# Patient Record
Sex: Female | Born: 1935 | Race: White | Hispanic: No | Marital: Married | Smoking: Never smoker
Health system: Southern US, Community
[De-identification: ages and names within clinical notes are randomized; demographics above are authoritative.]

## PROBLEM LIST (undated history)

## (undated) DIAGNOSIS — I6529 Occlusion and stenosis of unspecified carotid artery: Secondary | ICD-10-CM

## (undated) DIAGNOSIS — M810 Age-related osteoporosis without current pathological fracture: Secondary | ICD-10-CM

## (undated) DIAGNOSIS — I10 Essential (primary) hypertension: Secondary | ICD-10-CM

## (undated) DIAGNOSIS — R2681 Unsteadiness on feet: Secondary | ICD-10-CM

## (undated) DIAGNOSIS — H3530 Unspecified macular degeneration: Secondary | ICD-10-CM

## (undated) DIAGNOSIS — E039 Hypothyroidism, unspecified: Secondary | ICD-10-CM

## (undated) DIAGNOSIS — I2510 Atherosclerotic heart disease of native coronary artery without angina pectoris: Secondary | ICD-10-CM

## (undated) DIAGNOSIS — R569 Unspecified convulsions: Secondary | ICD-10-CM

## (undated) DIAGNOSIS — E538 Deficiency of other specified B group vitamins: Secondary | ICD-10-CM

## (undated) DIAGNOSIS — I4892 Unspecified atrial flutter: Secondary | ICD-10-CM

## (undated) DIAGNOSIS — E559 Vitamin D deficiency, unspecified: Secondary | ICD-10-CM

## (undated) HISTORY — PX: OTHER SURGICAL HISTORY: SHX169

## (undated) HISTORY — PX: CHOLECYSTECTOMY: SHX55

---

## 2004-05-08 HISTORY — PX: HIP SURGERY: SHX245

## 2011-12-17 ENCOUNTER — Encounter (HOSPITAL_COMMUNITY): Payer: Self-pay | Admitting: *Deleted

## 2011-12-17 ENCOUNTER — Emergency Department (HOSPITAL_COMMUNITY): Payer: Medicare Other

## 2011-12-17 ENCOUNTER — Emergency Department (HOSPITAL_COMMUNITY)
Admission: EM | Admit: 2011-12-17 | Discharge: 2011-12-17 | Disposition: A | Discharge status: Home / Self Care | Payer: Medicare Other | Attending: Emergency Medicine | Admitting: Emergency Medicine

## 2011-12-17 DIAGNOSIS — Y92410 Unspecified street and highway as the place of occurrence of the external cause: Secondary | ICD-10-CM | POA: Insufficient documentation

## 2011-12-17 DIAGNOSIS — G8929 Other chronic pain: Secondary | ICD-10-CM | POA: Insufficient documentation

## 2011-12-17 DIAGNOSIS — Z9089 Acquired absence of other organs: Secondary | ICD-10-CM | POA: Insufficient documentation

## 2011-12-17 DIAGNOSIS — E119 Type 2 diabetes mellitus without complications: Secondary | ICD-10-CM | POA: Insufficient documentation

## 2011-12-17 DIAGNOSIS — M25559 Pain in unspecified hip: Secondary | ICD-10-CM | POA: Insufficient documentation

## 2011-12-17 DIAGNOSIS — Z96649 Presence of unspecified artificial hip joint: Secondary | ICD-10-CM | POA: Insufficient documentation

## 2011-12-17 LAB — CBC WITH DIFFERENTIAL/PLATELET
Basophils Relative: 0 % (ref 0–1)
Eosinophils Absolute: 0.3 10*3/uL (ref 0.0–0.7)
Eosinophils Relative: 3 % (ref 0–5)
HCT: 38.4 % (ref 36.0–46.0)
Hemoglobin: 12.6 g/dL (ref 12.0–15.0)
Lymphs Abs: 2.6 10*3/uL (ref 0.7–4.0)
MCH: 28.3 pg (ref 26.0–34.0)
MCHC: 32.8 g/dL (ref 30.0–36.0)
MCV: 86.3 fL (ref 78.0–100.0)
Monocytes Absolute: 1.4 10*3/uL — ABNORMAL HIGH (ref 0.1–1.0)
Monocytes Relative: 13 % — ABNORMAL HIGH (ref 3–12)
Neutrophils Relative %: 59 % (ref 43–77)
RBC: 4.45 MIL/uL (ref 3.87–5.11)

## 2011-12-17 LAB — BASIC METABOLIC PANEL
BUN: 25 mg/dL — ABNORMAL HIGH (ref 6–23)
Calcium: 9 mg/dL (ref 8.4–10.5)
Creatinine, Ser: 0.74 mg/dL (ref 0.50–1.10)
GFR calc Af Amer: 89 mL/min — ABNORMAL LOW (ref >90)
GFR calc non Af Amer: 77 mL/min — ABNORMAL LOW (ref >90)
Glucose, Bld: 88 mg/dL (ref 70–99)
Potassium: 4.6 mEq/L (ref 3.5–5.1)

## 2011-12-17 MED ORDER — ACETAMINOPHEN 500 MG PO TABS: 1000.0000 mg | ORAL_TABLET | Freq: Once | ORAL | Status: DC
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
## 2011-12-21 NOTE — ED Provider Notes (Signed)
I saw and evaluated the patient, reviewed the resident's note and I agree with the findings and plan.   Cyndra Numbers, MD 12/21/11 [phone]

## 2016-02-21 ENCOUNTER — Emergency Department (HOSPITAL_BASED_OUTPATIENT_CLINIC_OR_DEPARTMENT_OTHER): Payer: Medicare Other

## 2016-02-21 ENCOUNTER — Emergency Department (HOSPITAL_BASED_OUTPATIENT_CLINIC_OR_DEPARTMENT_OTHER)
Admission: EM | Admit: 2016-02-21 | Discharge: 2016-02-21 | Disposition: A | Discharge status: Skilled Nursing Facility | Payer: Medicare Other | Attending: Emergency Medicine | Admitting: Emergency Medicine

## 2016-02-21 ENCOUNTER — Encounter (HOSPITAL_BASED_OUTPATIENT_CLINIC_OR_DEPARTMENT_OTHER): Payer: Self-pay | Admitting: Emergency Medicine

## 2016-02-21 DIAGNOSIS — E119 Type 2 diabetes mellitus without complications: Secondary | ICD-10-CM | POA: Diagnosis not present

## 2016-02-21 DIAGNOSIS — Z791 Long term (current) use of non-steroidal anti-inflammatories (NSAID): Secondary | ICD-10-CM | POA: Insufficient documentation

## 2016-02-21 DIAGNOSIS — Y999 Unspecified external cause status: Secondary | ICD-10-CM | POA: Insufficient documentation

## 2016-02-21 DIAGNOSIS — I2510 Atherosclerotic heart disease of native coronary artery without angina pectoris: Secondary | ICD-10-CM | POA: Diagnosis not present

## 2016-02-21 DIAGNOSIS — Y929 Unspecified place or not applicable: Secondary | ICD-10-CM | POA: Insufficient documentation

## 2016-02-21 DIAGNOSIS — S01311A Laceration without foreign body of right ear, initial encounter: Secondary | ICD-10-CM | POA: Diagnosis not present

## 2016-02-21 DIAGNOSIS — Z79899 Other long term (current) drug therapy: Secondary | ICD-10-CM | POA: Diagnosis not present

## 2016-02-21 DIAGNOSIS — W19XXXA Unspecified fall, initial encounter: Secondary | ICD-10-CM

## 2016-02-21 DIAGNOSIS — Z7982 Long term (current) use of aspirin: Secondary | ICD-10-CM | POA: Diagnosis not present

## 2016-02-21 DIAGNOSIS — W228XXA Striking against or struck by other objects, initial encounter: Secondary | ICD-10-CM | POA: Diagnosis not present

## 2016-02-21 DIAGNOSIS — S0990XA Unspecified injury of head, initial encounter: Secondary | ICD-10-CM | POA: Diagnosis present

## 2016-02-21 DIAGNOSIS — Y939 Activity, unspecified: Secondary | ICD-10-CM | POA: Diagnosis not present

## 2016-02-21 DIAGNOSIS — I10 Essential (primary) hypertension: Secondary | ICD-10-CM | POA: Insufficient documentation

## 2016-02-21 DIAGNOSIS — E039 Hypothyroidism, unspecified: Secondary | ICD-10-CM | POA: Diagnosis not present

## 2016-02-21 HISTORY — DX: Occlusion and stenosis of unspecified carotid artery: I65.29

## 2016-02-21 HISTORY — DX: Unsteadiness on feet: R26.81

## 2016-02-21 HISTORY — DX: Deficiency of other specified B group vitamins: E53.8

## 2016-02-21 HISTORY — DX: Atherosclerotic heart disease of native coronary artery without angina pectoris: I25.10

## 2016-02-21 HISTORY — DX: Unspecified macular degeneration: H35.30

## 2016-02-21 HISTORY — DX: Age-related osteoporosis without current pathological fracture: M81.0

## 2016-02-21 HISTORY — DX: Unspecified convulsions: R56.9

## 2016-02-21 HISTORY — DX: Vitamin D deficiency, unspecified: E55.9

## 2016-02-21 HISTORY — DX: Unspecified atrial flutter: I48.92

## 2016-02-21 HISTORY — DX: Essential (primary) hypertension: I10

## 2016-02-21 HISTORY — DX: Hypothyroidism, unspecified: E03.9

## 2016-02-21 LAB — CBC
HCT: 40.5 % (ref 36.0–46.0)
HEMOGLOBIN: 13.4 g/dL (ref 12.0–15.0)
MCH: 29.3 pg (ref 26.0–34.0)
MCHC: 33.1 g/dL (ref 30.0–36.0)
MCV: 88.6 fL (ref 78.0–100.0)
Platelets: 266 10*3/uL (ref 150–400)
RBC: 4.57 MIL/uL (ref 3.87–5.11)
RDW: 16.3 % — ABNORMAL HIGH (ref 11.5–15.5)
WBC: 7.5 10*3/uL (ref 4.0–10.5)

## 2016-02-21 LAB — URINALYSIS, ROUTINE W REFLEX MICROSCOPIC
Bilirubin Urine: NEGATIVE
GLUCOSE, UA: NEGATIVE mg/dL
HGB URINE DIPSTICK: NEGATIVE
Ketones, ur: 15 mg/dL — AB
LEUKOCYTES UA: NEGATIVE
Nitrite: NEGATIVE
Protein, ur: NEGATIVE mg/dL
SPECIFIC GRAVITY, URINE: 1.014 (ref 1.005–1.030)
pH: 7 (ref 5.0–8.0)

## 2016-02-21 LAB — BASIC METABOLIC PANEL
ANION GAP: 7 (ref 5–15)
BUN: 18 mg/dL (ref 6–20)
CHLORIDE: 102 mmol/L (ref 101–111)
CO2: 27 mmol/L (ref 22–32)
Calcium: 9 mg/dL (ref 8.9–10.3)
Creatinine, Ser: 0.81 mg/dL (ref 0.44–1.00)
GFR calc non Af Amer: >60 mL/min (ref >60)
Glucose, Bld: 96 mg/dL (ref 65–99)
Potassium: 4.3 mmol/L (ref 3.5–5.1)
Sodium: 136 mmol/L (ref 135–145)

## 2016-02-21 MED ORDER — LIDOCAINE HCL 1 % IJ SOLN
5.0000 mL | Freq: Once | INTRAMUSCULAR | Status: AC
  Administered 2016-02-21: 5 mL via INTRADERMAL
  Filled 2016-02-21: qty 20

## 2016-02-21 NOTE — ED Notes (Signed)
[facility] here for transport. 

## 2016-02-21 NOTE — ED Notes (Signed)
[facility] called for transport.  

## 2016-02-21 NOTE — ED Notes (Signed)
Pt has lac to the top of her rt ear. Cleaned with sterile water and swabs.
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
## 2016-02-21 NOTE — ED Triage Notes (Signed)
Pt from nursing home, unwitnessed fall.  Pt was standing up from the toilet, got dizzy and tipped over.  Pt had dizziness all morning.  Pt has laceration to right ear from hitting the wall.  NO Loc.

## 2016-02-21 NOTE — ED Notes (Signed)
MD at bedside. 

## 2016-07-19 ENCOUNTER — Emergency Department (HOSPITAL_BASED_OUTPATIENT_CLINIC_OR_DEPARTMENT_OTHER)
Admission: EM | Admit: 2016-07-19 | Discharge: 2016-07-19 | Disposition: A | Discharge status: Home / Self Care | Payer: Medicare Other | Attending: Emergency Medicine | Admitting: Emergency Medicine

## 2016-07-19 ENCOUNTER — Encounter (HOSPITAL_BASED_OUTPATIENT_CLINIC_OR_DEPARTMENT_OTHER): Payer: Self-pay | Admitting: *Deleted

## 2016-07-19 ENCOUNTER — Emergency Department (HOSPITAL_BASED_OUTPATIENT_CLINIC_OR_DEPARTMENT_OTHER): Payer: Medicare Other

## 2016-07-19 DIAGNOSIS — W19XXXD Unspecified fall, subsequent encounter: Secondary | ICD-10-CM

## 2016-07-19 DIAGNOSIS — Z7982 Long term (current) use of aspirin: Secondary | ICD-10-CM | POA: Insufficient documentation

## 2016-07-19 DIAGNOSIS — E039 Hypothyroidism, unspecified: Secondary | ICD-10-CM | POA: Diagnosis not present

## 2016-07-19 DIAGNOSIS — Y929 Unspecified place or not applicable: Secondary | ICD-10-CM | POA: Diagnosis not present

## 2016-07-19 DIAGNOSIS — M25462 Effusion, left knee: Secondary | ICD-10-CM | POA: Diagnosis not present

## 2016-07-19 DIAGNOSIS — M542 Cervicalgia: Secondary | ICD-10-CM | POA: Diagnosis not present

## 2016-07-19 DIAGNOSIS — I10 Essential (primary) hypertension: Secondary | ICD-10-CM | POA: Insufficient documentation

## 2016-07-19 DIAGNOSIS — R51 Headache: Secondary | ICD-10-CM | POA: Diagnosis not present

## 2016-07-19 DIAGNOSIS — I2510 Atherosclerotic heart disease of native coronary artery without angina pectoris: Secondary | ICD-10-CM | POA: Insufficient documentation

## 2016-07-19 DIAGNOSIS — H539 Unspecified visual disturbance: Secondary | ICD-10-CM | POA: Insufficient documentation

## 2016-07-19 DIAGNOSIS — Z79899 Other long term (current) drug therapy: Secondary | ICD-10-CM | POA: Diagnosis not present

## 2016-07-19 DIAGNOSIS — S50311A Abrasion of right elbow, initial encounter: Secondary | ICD-10-CM | POA: Diagnosis not present

## 2016-07-19 DIAGNOSIS — S199XXA Unspecified injury of neck, initial encounter: Secondary | ICD-10-CM | POA: Diagnosis present

## 2016-07-19 DIAGNOSIS — Y999 Unspecified external cause status: Secondary | ICD-10-CM | POA: Diagnosis not present

## 2016-07-19 DIAGNOSIS — W07XXXA Fall from chair, initial encounter: Secondary | ICD-10-CM | POA: Diagnosis not present

## 2016-07-19 DIAGNOSIS — Y939 Activity, unspecified: Secondary | ICD-10-CM | POA: Insufficient documentation

## 2016-07-19 DIAGNOSIS — E119 Type 2 diabetes mellitus without complications: Secondary | ICD-10-CM | POA: Diagnosis not present

## 2016-07-19 LAB — CBC
HEMATOCRIT: 38.1 % (ref 36.0–46.0)
HEMOGLOBIN: 12.5 g/dL (ref 12.0–15.0)
MCH: 28 pg (ref 26.0–34.0)
MCHC: 32.8 g/dL (ref 30.0–36.0)
MCV: 85.2 fL (ref 78.0–100.0)
Platelets: 319 10*3/uL (ref 150–400)
RBC: 4.47 MIL/uL (ref 3.87–5.11)
RDW: 17.2 % — ABNORMAL HIGH (ref 11.5–15.5)
WBC: 8.8 10*3/uL (ref 4.0–10.5)

## 2016-07-19 LAB — COMPREHENSIVE METABOLIC PANEL
ALBUMIN: 3.2 g/dL — AB (ref 3.5–5.0)
ALT: 10 U/L — AB (ref 14–54)
AST: 17 U/L (ref 15–41)
Alkaline Phosphatase: 49 U/L (ref 38–126)
Anion gap: 4 — ABNORMAL LOW (ref 5–15)
BILIRUBIN TOTAL: 0.4 mg/dL (ref 0.3–1.2)
BUN: 17 mg/dL (ref 6–20)
CALCIUM: 8.8 mg/dL — AB (ref 8.9–10.3)
CO2: 28 mmol/L (ref 22–32)
CREATININE: 0.9 mg/dL (ref 0.44–1.00)
Chloride: 103 mmol/L (ref 101–111)
GFR calc Af Amer: >60 mL/min (ref >60)
GFR, EST NON AFRICAN AMERICAN: 56 mL/min — AB (ref >60)
GLUCOSE: 92 mg/dL (ref 65–99)
POTASSIUM: 4.4 mmol/L (ref 3.5–5.1)
Sodium: 135 mmol/L (ref 135–145)
TOTAL PROTEIN: 6.2 g/dL — AB (ref 6.5–8.1)

## 2016-07-19 LAB — CBG MONITORING, ED: GLUCOSE-CAPILLARY: 78 mg/dL (ref 65–99)

## 2016-07-19 MED ORDER — ACETAMINOPHEN 325 MG PO TABS
650.0000 mg | ORAL_TABLET | Freq: Once | ORAL | Status: AC
  Administered 2016-07-19: 650 mg via ORAL
  Filled 2016-07-19 (×2): qty 2

## 2016-07-19 NOTE — ED Notes (Signed)
[facility], [facility].

## 2016-07-19 NOTE — ED Triage Notes (Signed)
Pt to room 5 by [facility], reporting pt with fall forward out of chair at [facility]. No loc per witnesses, no abrasions, lacerations, swelling or bleeding noted. Pt is a/a/ox4, smiling in nad.

## 2016-07-19 NOTE — ED Provider Notes (Signed)
[facility]-EMERGENCY DEPT Provider Note   CSN: [id number] Arrival date & time: 07/19/16  0930  History   Chief Complaint Chief Complaint  Patient presents with  . Fall    HPI Mallory Daniel is a 83 y.o. female with a h/o of falls, macular degeneration. and Afib who presents to the Emergency Department brought by EMS from assisted living following a fall. She has a history of chronic intermittent dizziness and fell this morning while bending over in her closet to get her shoes. No loss of consciousness. She was unable to get up on her own. She now presents with complaints of constant neck pain immediately following the fall, pain to the crown of her head, and left knee pain that began after she returned to her feet. She is arrived in a c-collar placed by EMS. No LOC. She denies back pain, pain to the left hip or ankle, abdominal or chest pain.   Ambulates with a walker at baseline. PMH includes Afib and macular degeneration. She is followed by Dr. Hyacinth [facility]ekerMiller for [facility] [place]. Next follow-up appointment is tomorrow.   HPI  Past Medical History:  Diagnosis Date  . Atrial flutter (HCC)   . B12 deficiency   . Carotid artery stenosis   . Coronary artery disease   . Diabetes mellitus   . Hypertension   . Hypothyroidism   . Macular degeneration   . Osteoporosis   . Seizures (HCC)   . Unsteady gait   . Vitamin D deficiency     There are no active problems to display for this patient.   Past Surgical History:  Procedure Laterality Date  . CHOLECYSTECTOMY    . gastic staples    . HIP SURGERY  2006   right hip replacement     OB History    No data available       Home Medications    Prior to Admission medications   Medication Sig Start Date End Date Taking? Authorizing Provider  acetaminophen (TYLENOL) 500 MG tablet Take 500 mg by mouth every 6 (six) hours as needed. For pain    Historical Provider, MD  aspirin 81 MG chewable tablet Chew by mouth daily.     Historical Provider, MD  cholecalciferol (VITAMIN D) 1000 units tablet Take 1,000 Units by mouth daily.    Historical Provider, MD  divalproex (DEPAKOTE) 125 MG DR tablet Take 125 mg by mouth 3 (three) times daily.    Historical Provider, MD  DULoxetine (CYMBALTA) 60 MG capsule Take 60 mg by mouth daily.     Historical Provider, MD  Estradiol [facility]) Place vaginally.    Historical Provider, MD  levothyroxine (SYNTHROID, LEVOTHROID) 88 MCG tablet Take 88 mcg by mouth daily before breakfast.    Historical Provider, MD  loratadine (CLARITIN) 10 MG tablet Take 10 mg by mouth daily.    Historical Provider, MD  metoprolol (LOPRESSOR) 50 MG tablet Take 25 mg by mouth 2 (two) times daily.    Historical Provider, MD  vitamin B-12 (CYANOCOBALAMIN) 1000 MCG tablet Take 1,000 mcg by mouth daily.    Historical Provider, MD  Vitamin D, Ergocalciferol, (DRISDOL) 50000 units CAPS capsule Take 50,000 Units by mouth every 7 (seven) days.    Historical Provider, MD    Family History History reviewed. No pertinent family history.  Social History Social History  Substance Use Topics  . Smoking status: Never Smoker  . Smokeless tobacco: Never Used  . Alcohol use No  Allergies   Demerol [meperidine] and Gabapentin   Review of Systems Review of Systems  Constitutional: Negative for fever.  Eyes: Positive for visual disturbance (macular degeneration- chronic).  Respiratory: Negative for shortness of breath.   Cardiovascular: Negative for chest pain.  Gastrointestinal: Negative for abdominal pain.  Genitourinary: Negative for flank pain.  Musculoskeletal: Positive for neck pain. Negative for back pain.  Skin: Positive for wound (previous fall).  Neurological: Positive for dizziness (chronic) and headaches.  Psychiatric/Behavioral: Negative for confusion.     Physical Exam Updated Vital Signs BP 118/82 (BP Location: Right Arm)   Pulse 66   Temp 98.1 F (36.7 C) (Oral)   Resp 18   SpO2  100%   Physical Exam  Constitutional: She is oriented to person, place, and time. She appears well-developed and well-nourished.  HENT:  Head: Normocephalic.  Right Ear: External ear normal.  Left Ear: External ear normal.  Nose: Nose normal.  Mildly TTP over the crown of the head and bilateral forehead. No ecchymosis or abrasions.   Eyes: Conjunctivae and EOM are normal. Pupils are equal, round, and reactive to light.  Visual fields limited bilaterally due to macular degeneration.   Neck:  Patient arrived in a c-collar.  Cardiovascular: Normal rate and intact distal pulses.  Exam reveals no gallop and no friction rub.   No murmur heard. Pulmonary/Chest: Effort normal and breath sounds normal. No respiratory distress. She has no wheezes. She exhibits no tenderness.  Abdominal: Soft. Bowel sounds are normal. She exhibits no distension and no mass. There is no tenderness. There is no rebound and no guarding.  Musculoskeletal: She exhibits tenderness. She exhibits no edema or deformity.       Left knee: She exhibits effusion (minimal). She exhibits normal range of motion, no ecchymosis, no deformity, no laceration, no erythema, normal patellar mobility, no bony tenderness and no MCL laxity. Tenderness found. Lateral joint line tenderness noted. No medial joint line and no patellar tendon tenderness noted.  TTP over the left posterior cervical spine. Mild midline tenderness. No ecchymosis or abrasions.     Neurological: She is alert and oriented to person, place, and time. No cranial nerve deficit or sensory deficit. She exhibits normal muscle tone.  Skin: Skin is warm and dry.  There is a 2.5 x 1.5 cm hemostatic abrasion to the right anterolateral elbow with granulomatous tissue. No signs of infection. The patient reports wound occurred from a fall on 07/14/16.   Psychiatric: Her behavior is normal.  Nursing note and vitals reviewed.   ED Treatments / Results  Labs (all labs ordered are  listed, but only abnormal results are displayed) Labs Reviewed  CBC - Abnormal; Notable for the following:       Result Value   RDW 17.2 (*)    All other components within normal limits  COMPREHENSIVE METABOLIC PANEL - Abnormal; Notable for the following:    Calcium 8.8 (*)    Total Protein 6.2 (*)    Albumin 3.2 (*)    ALT 10 (*)    GFR calc non Af Amer 56 (*)    Anion gap 4 (*)    All other components within normal limits  CBG MONITORING, ED    EKG  EKG Interpretation  Date/Time:  Wednesday July 19 2016 11:32:14 EDT Ventricular Rate:  60 PR Interval:    QRS Duration: 100 QT Interval:  424 QTC Calculation: 424 R Axis:   60 Text Interpretation:  Atrial fibrillation Low voltage, precordial leads  ST/T changes less prominent than when compared to Oct 2017 Confirmed by Criss Alvine MD, SCOTT [phone]) on 07/19/2016 11:36:58 AM       Radiology Ct Head Wo Contrast  Result Date: 07/19/2016 CLINICAL DATA:  Recent fall with head and neck pain, initial encounter EXAM: CT HEAD WITHOUT CONTRAST CT CERVICAL SPINE WITHOUT CONTRAST TECHNIQUE: Multidetector CT imaging of the head and cervical spine was performed following the standard protocol without intravenous contrast. Multiplanar CT image reconstructions of the cervical spine were also generated. COMPARISON:  03/05/2016 FINDINGS: CT HEAD FINDINGS Brain: Chronic atrophic and chronic white matter ischemic changes are noted. No findings to suggest acute hemorrhage, acute infarction or space-occupying mass lesion are seen. Vascular: No hyperdense vessel or unexpected calcification. Skull: Normal. Negative for fracture or focal lesion. Sinuses/Orbits: No acute finding. Other: None. CT CERVICAL SPINE FINDINGS Alignment: Within normal limits. Skull base and vertebrae: 7 cervical segments are well visualized. Vertebral body height is well maintained. No acute fracture or acute facet abnormality is seen. Soft tissues and spinal canal: No prevertebral fluid  or swelling. No visible canal hematoma. Disc levels: No acute disc pathology is noted. Some disc space narrowing is again seen at C5-6 stable from the prior exam. Upper chest: Negative. IMPRESSION: CT of the head: Chronic changes without acute abnormality. CT of cervical spine: Mild degenerative change without acute abnormality. Electronically Signed   By: Alcide Clever M.D.   On: 07/19/2016 10:56   Ct Cervical Spine Wo Contrast  Result Date: 07/19/2016 CLINICAL DATA:  Recent fall with head and neck pain, initial encounter EXAM: CT HEAD WITHOUT CONTRAST CT CERVICAL SPINE WITHOUT CONTRAST TECHNIQUE: Multidetector CT imaging of the head and cervical spine was performed following the standard protocol without intravenous contrast. Multiplanar CT image reconstructions of the cervical spine were also generated. COMPARISON:  03/05/2016 FINDINGS: CT HEAD FINDINGS Brain: Chronic atrophic and chronic white matter ischemic changes are noted. No findings to suggest acute hemorrhage, acute infarction or space-occupying mass lesion are seen. Vascular: No hyperdense vessel or unexpected calcification. Skull: Normal. Negative for fracture or focal lesion. Sinuses/Orbits: No acute finding. Other: None. CT CERVICAL SPINE FINDINGS Alignment: Within normal limits. Skull base and vertebrae: 7 cervical segments are well visualized. Vertebral body height is well maintained. No acute fracture or acute facet abnormality is seen. Soft tissues and spinal canal: No prevertebral fluid or swelling. No visible canal hematoma. Disc levels: No acute disc pathology is noted. Some disc space narrowing is again seen at C5-6 stable from the prior exam. Upper chest: Negative. IMPRESSION: CT of the head: Chronic changes without acute abnormality. CT of cervical spine: Mild degenerative change without acute abnormality. Electronically Signed   By: Alcide Clever M.D.   On: 07/19/2016 10:56   Dg Knee Complete 4 Views Left  Result Date:  07/19/2016 CLINICAL DATA:  Pain following fall EXAM: LEFT KNEE - COMPLETE 4+ VIEW COMPARISON:  None. FINDINGS: Frontal, lateral, and bilateral oblique views were obtained. There is no appreciable fracture or dislocation. There is a small joint effusion. There is moderate generalized joint space narrowing, slightly more severe laterally than elsewhere. There is spurring in all compartments. Bones are osteoporotic. No erosive change. IMPRESSION: Multifocal osteoarthritic change. Small joint effusion. Bones somewhat osteoporotic. No fracture or dislocation. Electronically Signed   By: Bretta Bang III M.D.   On: 07/19/2016 10:53    Procedures Procedures (including critical care time)  Medications Ordered in ED Medications  acetaminophen (TYLENOL) tablet 650 mg (650 mg Oral Given 07/19/16  1027)     Initial Impression / Assessment and Plan / ED Course  I have reviewed the triage vital signs and the nursing notes.  Pertinent labs & imaging results that were available during my care of the patient were reviewed by me and considered in my medical decision making (see chart for details).     83 year old patient with chronic dizziness, Afib, and recurrent falls here for evaluation following a fall. She is TTP over the posterior neck, left lateral knee, forehead and crown of the head, and behind the left eye. C-spine and head CT are negative for fracture. Left knee X-ray is negative. No electrolyte abnormalities seen on labs. Kidney function is normal. The patient was ambulated through the unit with assistance, and was somewhat unsteady, but reports this is her baseline. The patient has a follow-up appointment with her neurologist scheduled for 3/15. Discussed discharging the patient to assisted living and following up with neurology tomorrow. She is agreeable with the plan at this time.   Final Clinical Impressions(s) / ED Diagnoses   Final diagnoses:  Fall, subsequent encounter    New  Prescriptions Discharge Medication List as of 07/19/2016  1:54 PM       Rahman Ferrall Conan Bowens, PA-C 07/20/16 1026    Pricilla Loveless, MD 07/29/16 [phone]

## 2016-07-19 NOTE — ED Triage Notes (Signed)
Pt reports neck pain, c collar in place by ems.

## 2016-07-19 NOTE — ED Notes (Signed)
Patient transported to X-ray 

## 2016-07-19 NOTE — ED Notes (Signed)
ED Provider at bedside. 

## 2016-07-19 NOTE — Discharge Instructions (Signed)
Pain can be treated with Tylenol.

## 2016-08-02 ENCOUNTER — Emergency Department (HOSPITAL_BASED_OUTPATIENT_CLINIC_OR_DEPARTMENT_OTHER): Payer: Medicare Other

## 2016-08-02 ENCOUNTER — Emergency Department (HOSPITAL_BASED_OUTPATIENT_CLINIC_OR_DEPARTMENT_OTHER)
Admission: EM | Admit: 2016-08-02 | Discharge: 2016-08-02 | Disposition: A | Discharge status: Home / Self Care | Payer: Medicare Other | Attending: Emergency Medicine | Admitting: Emergency Medicine

## 2016-08-02 ENCOUNTER — Encounter (HOSPITAL_BASED_OUTPATIENT_CLINIC_OR_DEPARTMENT_OTHER): Payer: Self-pay | Admitting: Emergency Medicine

## 2016-08-02 DIAGNOSIS — Z7982 Long term (current) use of aspirin: Secondary | ICD-10-CM | POA: Diagnosis not present

## 2016-08-02 DIAGNOSIS — E119 Type 2 diabetes mellitus without complications: Secondary | ICD-10-CM | POA: Diagnosis not present

## 2016-08-02 DIAGNOSIS — Y999 Unspecified external cause status: Secondary | ICD-10-CM | POA: Insufficient documentation

## 2016-08-02 DIAGNOSIS — Z7901 Long term (current) use of anticoagulants: Secondary | ICD-10-CM | POA: Diagnosis not present

## 2016-08-02 DIAGNOSIS — T148XXA Other injury of unspecified body region, initial encounter: Secondary | ICD-10-CM | POA: Insufficient documentation

## 2016-08-02 DIAGNOSIS — Y939 Activity, unspecified: Secondary | ICD-10-CM | POA: Insufficient documentation

## 2016-08-02 DIAGNOSIS — M25511 Pain in right shoulder: Secondary | ICD-10-CM | POA: Diagnosis not present

## 2016-08-02 DIAGNOSIS — R51 Headache: Secondary | ICD-10-CM | POA: Insufficient documentation

## 2016-08-02 DIAGNOSIS — I10 Essential (primary) hypertension: Secondary | ICD-10-CM | POA: Insufficient documentation

## 2016-08-02 DIAGNOSIS — I2510 Atherosclerotic heart disease of native coronary artery without angina pectoris: Secondary | ICD-10-CM | POA: Insufficient documentation

## 2016-08-02 DIAGNOSIS — Y92009 Unspecified place in unspecified non-institutional (private) residence as the place of occurrence of the external cause: Secondary | ICD-10-CM | POA: Diagnosis not present

## 2016-08-02 DIAGNOSIS — E039 Hypothyroidism, unspecified: Secondary | ICD-10-CM | POA: Insufficient documentation

## 2016-08-02 DIAGNOSIS — W19XXXA Unspecified fall, initial encounter: Secondary | ICD-10-CM | POA: Insufficient documentation

## 2016-08-02 DIAGNOSIS — S4991XA Unspecified injury of right shoulder and upper arm, initial encounter: Secondary | ICD-10-CM | POA: Diagnosis present

## 2016-08-02 LAB — BASIC METABOLIC PANEL
Anion gap: 5 (ref 5–15)
BUN: 17 mg/dL (ref 6–20)
CHLORIDE: 104 mmol/L (ref 101–111)
CO2: 29 mmol/L (ref 22–32)
CREATININE: 0.94 mg/dL (ref 0.44–1.00)
Calcium: 9 mg/dL (ref 8.9–10.3)
GFR calc Af Amer: >60 mL/min (ref >60)
GFR, EST NON AFRICAN AMERICAN: 53 mL/min — AB (ref >60)
Glucose, Bld: 84 mg/dL (ref 65–99)
Potassium: 4.2 mmol/L (ref 3.5–5.1)
SODIUM: 138 mmol/L (ref 135–145)

## 2016-08-02 LAB — URINALYSIS, ROUTINE W REFLEX MICROSCOPIC
BILIRUBIN URINE: NEGATIVE
GLUCOSE, UA: NEGATIVE mg/dL
HGB URINE DIPSTICK: NEGATIVE
Ketones, ur: NEGATIVE mg/dL
Leukocytes, UA: NEGATIVE
Nitrite: NEGATIVE
Protein, ur: NEGATIVE mg/dL
SPECIFIC GRAVITY, URINE: 1.017 (ref 1.005–1.030)
pH: 7 (ref 5.0–8.0)

## 2016-08-02 LAB — VALPROIC ACID LEVEL: Valproic Acid Lvl: 16 ug/mL — ABNORMAL LOW (ref 50.0–100.0)

## 2016-08-02 LAB — CK: CK TOTAL: 28 U/L — AB (ref 38–234)

## 2016-08-02 LAB — DIGOXIN LEVEL: DIGOXIN LVL: 0.7 ng/mL — AB (ref 0.8–2.0)

## 2016-08-02 NOTE — ED Notes (Signed)
Spoke to daughter of pt.  Informed her of patients condition and findings.  Notified of patient being discharged with changes to medication.  She related pt also has appt with neurologist next Wednesday.

## 2016-08-02 NOTE — ED Triage Notes (Signed)
Pt from nursing facility.  Pt found by staff on the floor after falling.  Pt has hematoma to back of head and c/o right shoulder pain.  EKG per ems shows Afib rate 57.  Pt denies dizziness.

## 2016-08-02 NOTE — ED Provider Notes (Signed)
[facility]   CSN: [id number] Arrival date & time: 08/02/16  [id number]     History   Chief Complaint Chief Complaint  Patient presents with  . Fall    HPI Mallory Daniel is a 83 y.o. female.History is obtained fromDwynetta Mclean, medical technician at skilled nursing facility, from patient and from records accompanying patient. Patient was found on the floor 7 AM today lying flat on her back. Patient thinks she fell approximately 11 PM last night because "[employer] [place] was on" she complains of mild occipital headache and mild right shoulder pain since the fall. No other associated symptoms. No treatment prior to coming here. She denies neck pain. Nothing made symptoms better or worse. Patient is prone to frequent falls.  HPI  Past Medical History:  Diagnosis Date  . Atrial flutter (HCC)   . B12 deficiency   . Carotid artery stenosis   . Coronary artery disease   . Diabetes mellitus   . Hypertension   . Hypothyroidism   . Macular degeneration   . Osteoporosis   . Seizures (HCC)   . Unsteady gait   . Vitamin D deficiency     There are no active problems to display for this patient.   Past Surgical History:  Procedure Laterality Date  . CHOLECYSTECTOMY    . gastic staples    . HIP SURGERY  2006   right hip replacement     OB History    No data available       Home Medications    Prior to Admission medications   Medication Sig Start Date End Date Taking? Authorizing Provider  acetaminophen (TYLENOL) 500 MG tablet Take 500 mg by mouth every 6 (six) hours as needed. For pain   Yes Historical Provider, MD  aspirin 81 MG chewable tablet Chew by mouth daily.   Yes Historical Provider, MD  digoxin (LANOXIN) 0.125 MG tablet Take 0.125 mg by mouth daily.   Yes Historical Provider, MD  divalproex (DEPAKOTE) 125 MG DR tablet Take 125 mg by mouth 3 (three) times daily.   Yes Historical Provider, MD  DULoxetine (CYMBALTA) 60 MG capsule Take 60 mg by  mouth daily.    Yes Historical Provider, MD  escitalopram (LEXAPRO) 10 MG tablet Take 10 mg by mouth daily.   Yes Historical Provider, MD  estradiol (ESTRACE) 0.1 MG/GM vaginal cream Place 1 Applicatorful vaginally every 14 (fourteen) days.   Yes Historical Provider, MD  levothyroxine (SYNTHROID, LEVOTHROID) 88 MCG tablet Take 88 mcg by mouth daily before breakfast.   Yes Historical Provider, MD  loratadine (CLARITIN) 10 MG tablet Take 10 mg by mouth daily.   Yes Historical Provider, MD  metoprolol (LOPRESSOR) 50 MG tablet Take 25 mg by mouth 2 (two) times daily.   Yes Historical Provider, MD  vitamin B-12 (CYANOCOBALAMIN) 1000 MCG tablet Take 1,000 mcg by mouth daily.   Yes Historical Provider, MD  Vitamin D, Ergocalciferol, (DRISDOL) 50000 units CAPS capsule Take 50,000 Units by mouth every 7 (seven) days.   Yes Historical Provider, MD    Family History No family history on file.  Social History Social History  Substance Use Topics  . Smoking status: Never Smoker  . Smokeless tobacco: Never Used  . Alcohol use No     Allergies   Demerol [meperidine]; Gabapentin; and Statins   Review of Systems Review of Systems  Musculoskeletal: Positive for arthralgias and gait problem.       Walks with walker. Right shoulder  pain.  Neurological: Positive for headaches.     Physical Exam Updated Vital Signs BP 127/74 (BP Location: Left Arm)   Pulse 65   Temp 97.9 F (36.6 C) (Oral)   Resp 18   Ht 4\' 11"  (1.499 m)   Wt 144 lb (65.3 kg)   SpO2 96%   BMI 29.08 kg/m   Physical Exam  Constitutional: She appears well-developed and well-nourished.  Alert appropriate Glasgow Coma Score 15  HENT:  Slight swelling occiput with minimal course point tenderness. Otherwise normocephalic atraumatic  Eyes: EOM are normal.  Neck: Neck supple. No tracheal deviation present. No thyromegaly present.  No point tenderness no bruit  Cardiovascular:  No murmur heard. Irregularly irregular    Pulmonary/Chest: Effort normal and breath sounds normal.  Abdominal: Soft. Bowel sounds are normal. She exhibits no distension. There is no tenderness.  Musculoskeletal: Normal range of motion. She exhibits no edema or tenderness.  Entire spine nontender. Pelvis stable nontender. Right upper extremity minimally tender at the posterior shoulder. No ecchymosis. Full range of motion. All other extremities or contusion abrasion or tenderness neurovascularly intact  Neurological: She is alert. Coordination normal.  Moves all extremities motor strength 5 over 5 overall cranial nerves II through XII grossly intact. No facial asymmetry.  Skin: Skin is warm and dry. No rash noted.  Psychiatric: She has a normal mood and affect.  Nursing note and vitals reviewed.    ED Treatments / Results  Labs (all labs ordered are listed, but only abnormal results are displayed) Labs Reviewed  CK  BASIC METABOLIC PANEL  URINALYSIS, ROUTINE W REFLEX MICROSCOPIC  DIGOXIN LEVEL  VALPROIC ACID LEVEL    EKG  EKG Interpretation  Date/Time:  Wednesday August 02 2016 08:37:55 EDT Ventricular Rate:  70 PR Interval:    QRS Duration: 103 QT Interval:  411 QTC Calculation: 444 R Axis:   36 Text Interpretation:  Atrial fibrillation Low voltage, precordial leads Borderline repolarization abnormality No significant change since last tracing Confirmed by Ethelda Chick  MD, Shakur Lembo ([id number]) on 08/02/2016 8:45:24 AM       Radiology No results found.  Procedures Procedures (including critical care time)  Medications Ordered in ED Medications - No data to display   Initial Impression / Assessment and Plan / ED Course  I have reviewed the triage vital signs and the nursing notes.  Pertinent labs & imaging results that were available during my care of the patient were reviewed by me and considered in my medical decision making (see chart for details).   declines pain medicine  X-rays viewed by me Patient's  valproic acid level subtherapeutic. Discussed with hospital pharmacist. Will increased Depakote DR dosage to 125 mg 3 times a day. Her MAR states she is taking Depakote 125 mg once daily presently. Depakote level should be rechecked within 2 weeks. I dont feelthat digoxin dosage needs to be adjusted and that she has normal vital signs.  At 10:25 PM she is asymptomatic and pain-free Results for orders placed or performed during the hospital encounter of 08/02/16  CK  Result Value Ref Range   Total CK 28 (L) 38 - 234 U/L  Basic metabolic panel  Result Value Ref Range   Sodium 138 135 - 145 mmol/L   Potassium 4.2 3.5 - 5.1 mmol/L   Chloride 104 101 - 111 mmol/L   CO2 29 22 - 32 mmol/L   Glucose, Bld 84 65 - 99 mg/dL   BUN 17 6 - 20 mg/dL  Creatinine, Ser 0.94 0.44 - 1.00 mg/dL   Calcium 9.0 8.9 - 16.1 mg/dL   GFR calc non Af Amer 53 (L) >60 mL/min   GFR calc Af Amer >60 >60 mL/min   Anion gap 5 5 - 15  Urinalysis, Routine w reflex microscopic  Result Value Ref Range   Color, Urine YELLOW YELLOW   APPearance CLEAR CLEAR   Specific Gravity, Urine 1.017 1.005 - 1.030   pH 7.0 5.0 - 8.0   Glucose, UA NEGATIVE NEGATIVE mg/dL   Hgb urine dipstick NEGATIVE NEGATIVE   Bilirubin Urine NEGATIVE NEGATIVE   Ketones, ur NEGATIVE NEGATIVE mg/dL   Protein, ur NEGATIVE NEGATIVE mg/dL   Nitrite NEGATIVE NEGATIVE   Leukocytes, UA NEGATIVE NEGATIVE  Digoxin level  Result Value Ref Range   Digoxin Level 0.7 (L) 0.8 - 2.0 ng/mL  Valproic acid level  Result Value Ref Range   Valproic Acid Lvl 16 (L) 50.0 - 100.0 ug/mL   Dg Shoulder Right  Result Date: 08/02/2016 CLINICAL DATA:  [place] syncope and fall last night with a right shoulder injury. Pain. Initial encounter. EXAM: RIGHT SHOULDER - 2+ VIEW COMPARISON:  None. FINDINGS: There is no evidence of fracture or dislocation. Mild acromioclavicular osteoarthritis is seen. Image right lung and ribs appear normal. Soft tissues are unremarkable.  IMPRESSION: No acute abnormality. Mild acromioclavicular osteoarthritis. Electronically Signed   By: Drusilla Kanner M.D.   On: 08/02/2016 09:16   Ct Head Wo Contrast  Result Date: 08/02/2016 CLINICAL DATA:  Larey Seat during the night in the home. Found on the floor. EXAM: CT HEAD WITHOUT CONTRAST CT CERVICAL SPINE WITHOUT CONTRAST TECHNIQUE: Multidetector CT imaging of the head and cervical spine was performed following the standard protocol without intravenous contrast. Multiplanar CT image reconstructions of the cervical spine were also generated. COMPARISON:  07/19/2016 FINDINGS: CT HEAD FINDINGS Brain: Generalized brain atrophy. Chronic small-vessel ischemic changes throughout the cerebral hemispheric white matter. Old small vessel infarctions within the thalami and basal ganglia. No sign of acute infarction, mass lesion, hemorrhage, hydrocephalus or extra-axial collection. Vascular: There is atherosclerotic calcification of the major vessels at the base of the brain. Skull: Normal Sinuses/Orbits: Clear/normal Other: None CT CERVICAL SPINE FINDINGS Alignment: Normal Skull base and vertebrae: No fracture or primary lesion. Soft tissues and spinal canal: Negative Disc levels: Ordinary disc space narrowing C5-6 and C6-7. No bony stenosis of the canal or foramina. Upper chest: Negative Other: None IMPRESSION: Head CT: No acute or traumatic finding. Atrophy and chronic small-vessel ischemic changes. Cervical spine CT: No acute or traumatic finding. Mild midcervical spondylosis. Electronically Signed   By: Paulina Fusi M.D.   On: 08/02/2016 09:16   Ct Head Wo Contrast  Result Date: 07/19/2016 CLINICAL DATA:  Recent fall with head and neck pain, initial encounter EXAM: CT HEAD WITHOUT CONTRAST CT CERVICAL SPINE WITHOUT CONTRAST TECHNIQUE: Multidetector CT imaging of the head and cervical spine was performed following the standard protocol without intravenous contrast. Multiplanar CT image reconstructions of the  cervical spine were also generated. COMPARISON:  03/05/2016 FINDINGS: CT HEAD FINDINGS Brain: Chronic atrophic and chronic white matter ischemic changes are noted. No findings to suggest acute hemorrhage, acute infarction or space-occupying mass lesion are seen. Vascular: No hyperdense vessel or unexpected calcification. Skull: Normal. Negative for fracture or focal lesion. Sinuses/Orbits: No acute finding. Other: None. CT CERVICAL SPINE FINDINGS Alignment: Within normal limits. Skull base and vertebrae: 7 cervical segments are well visualized. Vertebral body height is well maintained. No acute fracture or  acute facet abnormality is seen. Soft tissues and spinal canal: No prevertebral fluid or swelling. No visible canal hematoma. Disc levels: No acute disc pathology is noted. Some disc space narrowing is again seen at C5-6 stable from the prior exam. Upper chest: Negative. IMPRESSION: CT of the head: Chronic changes without acute abnormality. CT of cervical spine: Mild degenerative change without acute abnormality. Electronically Signed   By: Alcide Clever M.D.   On: 07/19/2016 10:56   Ct Cervical Spine Wo Contrast  Result Date: 08/02/2016 CLINICAL DATA:  Larey Seat during the night in the home. Found on the floor. EXAM: CT HEAD WITHOUT CONTRAST CT CERVICAL SPINE WITHOUT CONTRAST TECHNIQUE: Multidetector CT imaging of the head and cervical spine was performed following the standard protocol without intravenous contrast. Multiplanar CT image reconstructions of the cervical spine were also generated. COMPARISON:  07/19/2016 FINDINGS: CT HEAD FINDINGS Brain: Generalized brain atrophy. Chronic small-vessel ischemic changes throughout the cerebral hemispheric white matter. Old small vessel infarctions within the thalami and basal ganglia. No sign of acute infarction, mass lesion, hemorrhage, hydrocephalus or extra-axial collection. Vascular: There is atherosclerotic calcification of the major vessels at the base of the  brain. Skull: Normal Sinuses/Orbits: Clear/normal Other: None CT CERVICAL SPINE FINDINGS Alignment: Normal Skull base and vertebrae: No fracture or primary lesion. Soft tissues and spinal canal: Negative Disc levels: Ordinary disc space narrowing C5-6 and C6-7. No bony stenosis of the canal or foramina. Upper chest: Negative Other: None IMPRESSION: Head CT: No acute or traumatic finding. Atrophy and chronic small-vessel ischemic changes. Cervical spine CT: No acute or traumatic finding. Mild midcervical spondylosis. Electronically Signed   By: Paulina Fusi M.D.   On: 08/02/2016 09:16   Ct Cervical Spine Wo Contrast  Result Date: 07/19/2016 CLINICAL DATA:  Recent fall with head and neck pain, initial encounter EXAM: CT HEAD WITHOUT CONTRAST CT CERVICAL SPINE WITHOUT CONTRAST TECHNIQUE: Multidetector CT imaging of the head and cervical spine was performed following the standard protocol without intravenous contrast. Multiplanar CT image reconstructions of the cervical spine were also generated. COMPARISON:  03/05/2016 FINDINGS: CT HEAD FINDINGS Brain: Chronic atrophic and chronic white matter ischemic changes are noted. No findings to suggest acute hemorrhage, acute infarction or space-occupying mass lesion are seen. Vascular: No hyperdense vessel or unexpected calcification. Skull: Normal. Negative for fracture or focal lesion. Sinuses/Orbits: No acute finding. Other: None. CT CERVICAL SPINE FINDINGS Alignment: Within normal limits. Skull base and vertebrae: 7 cervical segments are well visualized. Vertebral body height is well maintained. No acute fracture or acute facet abnormality is seen. Soft tissues and spinal canal: No prevertebral fluid or swelling. No visible canal hematoma. Disc levels: No acute disc pathology is noted. Some disc space narrowing is again seen at C5-6 stable from the prior exam. Upper chest: Negative. IMPRESSION: CT of the head: Chronic changes without acute abnormality. CT of cervical  spine: Mild degenerative change without acute abnormality. Electronically Signed   By: Alcide Clever M.D.   On: 07/19/2016 10:56   Dg Knee Complete 4 Views Left  Result Date: 07/19/2016 CLINICAL DATA:  Pain following fall EXAM: LEFT KNEE - COMPLETE 4+ VIEW COMPARISON:  None. FINDINGS: Frontal, lateral, and bilateral oblique views were obtained. There is no appreciable fracture or dislocation. There is a small joint effusion. There is moderate generalized joint space narrowing, slightly more severe laterally than elsewhere. There is spurring in all compartments. Bones are osteoporotic. No erosive change. IMPRESSION: Multifocal osteoarthritic change. Small joint effusion. Bones somewhat osteoporotic. No fracture  or dislocation. Electronically Signed   By: Bretta Bang III M.D.   On: 07/19/2016 10:53   Final Clinical Impressions(s) / ED Diagnoses  Dx #1 fall #2 contusions multiple sites #3 minor head injury #4 subtherapeutic anticonvulsant drug level #5 subtherapeutic digoxin Final diagnoses:  None    New Prescriptions New Prescriptions   No medications on file     Doug Sou, MD 08/02/16 1031

## 2016-08-02 NOTE — Discharge Instructions (Signed)
Increase Depakote DR dosage to 125 mg 3 times daily. Depakote level should be rechecked within the next 2 weeks. Level today was subtherapeutic at 16

## 2016-08-07 ENCOUNTER — Emergency Department (HOSPITAL_BASED_OUTPATIENT_CLINIC_OR_DEPARTMENT_OTHER)
Admission: EM | Admit: 2016-08-07 | Discharge: 2016-08-07 | Disposition: A | Discharge status: Home / Self Care | Payer: Medicare Other | Attending: Physician Assistant | Admitting: Physician Assistant

## 2016-08-07 ENCOUNTER — Emergency Department (HOSPITAL_BASED_OUTPATIENT_CLINIC_OR_DEPARTMENT_OTHER): Payer: Medicare Other

## 2016-08-07 ENCOUNTER — Encounter (HOSPITAL_BASED_OUTPATIENT_CLINIC_OR_DEPARTMENT_OTHER): Payer: Self-pay | Admitting: Emergency Medicine

## 2016-08-07 DIAGNOSIS — S299XXA Unspecified injury of thorax, initial encounter: Secondary | ICD-10-CM | POA: Diagnosis present

## 2016-08-07 DIAGNOSIS — I10 Essential (primary) hypertension: Secondary | ICD-10-CM | POA: Insufficient documentation

## 2016-08-07 DIAGNOSIS — I2510 Atherosclerotic heart disease of native coronary artery without angina pectoris: Secondary | ICD-10-CM | POA: Diagnosis not present

## 2016-08-07 DIAGNOSIS — Y939 Activity, unspecified: Secondary | ICD-10-CM | POA: Diagnosis not present

## 2016-08-07 DIAGNOSIS — Y929 Unspecified place or not applicable: Secondary | ICD-10-CM | POA: Insufficient documentation

## 2016-08-07 DIAGNOSIS — Z79899 Other long term (current) drug therapy: Secondary | ICD-10-CM | POA: Insufficient documentation

## 2016-08-07 DIAGNOSIS — E039 Hypothyroidism, unspecified: Secondary | ICD-10-CM | POA: Insufficient documentation

## 2016-08-07 DIAGNOSIS — W1839XA Other fall on same level, initial encounter: Secondary | ICD-10-CM | POA: Diagnosis not present

## 2016-08-07 DIAGNOSIS — S20211A Contusion of right front wall of thorax, initial encounter: Secondary | ICD-10-CM | POA: Diagnosis not present

## 2016-08-07 DIAGNOSIS — E119 Type 2 diabetes mellitus without complications: Secondary | ICD-10-CM | POA: Insufficient documentation

## 2016-08-07 DIAGNOSIS — Z7982 Long term (current) use of aspirin: Secondary | ICD-10-CM | POA: Diagnosis not present

## 2016-08-07 DIAGNOSIS — Y999 Unspecified external cause status: Secondary | ICD-10-CM | POA: Diagnosis not present

## 2016-08-07 MED ORDER — ACETAMINOPHEN 325 MG PO TABS
650.0000 mg | ORAL_TABLET | Freq: Once | ORAL | Status: AC
  Administered 2016-08-07: 650 mg via ORAL
  Filled 2016-08-07: qty 2

## 2016-08-07 NOTE — ED Notes (Signed)
Given snack and gingerale  

## 2016-08-07 NOTE — Discharge Instructions (Signed)
You were seen today because he had pain in your ribs after fall. There are no fractures. You continue to take ibuprofen use heating pads or otherwise to help. If you notice any rash starting please return immediately to the emergency department.

## 2016-08-07 NOTE — ED Provider Notes (Signed)
[facility]   CSN: [id number] Arrival date & time: 08/07/16  1040     History   Chief Complaint Chief Complaint  Patient presents with  . Rib pain    HPI Mallory Daniel is a 83 y.o. female.  HPI   Patient is a [age] feel. She reports she's had pain to her right side of her chest wall since the follow week ago. At that time only shoulder x-ray was completed. Patient is eating and drinking normally. No other complaints such as abdominal pain or otherwise.  No falls since that time.  Past Medical History:  Diagnosis Date  . Atrial flutter (HCC)   . B12 deficiency   . Carotid artery stenosis   . Coronary artery disease   . Diabetes mellitus   . Hypertension   . Hypothyroidism   . Macular degeneration   . Osteoporosis   . Seizures (HCC)   . Unsteady gait   . Vitamin D deficiency     There are no active problems to display for this patient.   Past Surgical History:  Procedure Laterality Date  . CHOLECYSTECTOMY    . gastic staples    . HIP SURGERY  2006   right hip replacement     OB History    No data available       Home Medications    Prior to Admission medications   Medication Sig Start Date End Date Taking? Authorizing Provider  acetaminophen (TYLENOL) 500 MG tablet Take 500 mg by mouth every 6 (six) hours as needed. For pain    Historical Provider, MD  aspirin 81 MG chewable tablet Chew by mouth daily.    Historical Provider, MD  digoxin (LANOXIN) 0.125 MG tablet Take 0.125 mg by mouth daily.    Historical Provider, MD  divalproex (DEPAKOTE) 125 MG DR tablet Take 125 mg by mouth 3 (three) times daily.    Historical Provider, MD  DULoxetine (CYMBALTA) 60 MG capsule Take 60 mg by mouth daily.     Historical Provider, MD  escitalopram (LEXAPRO) 10 MG tablet Take 10 mg by mouth daily.    Historical Provider, MD  estradiol (ESTRACE) 0.1 MG/GM vaginal cream Place 1 Applicatorful vaginally every 14 (fourteen) days.    Historical  Provider, MD  levothyroxine (SYNTHROID, LEVOTHROID) 88 MCG tablet Take 88 mcg by mouth daily before breakfast.    Historical Provider, MD  loratadine (CLARITIN) 10 MG tablet Take 10 mg by mouth daily.    Historical Provider, MD  metoprolol (LOPRESSOR) 50 MG tablet Take 25 mg by mouth 2 (two) times daily.    Historical Provider, MD  vitamin B-12 (CYANOCOBALAMIN) 1000 MCG tablet Take 1,000 mcg by mouth daily.    Historical Provider, MD  Vitamin D, Ergocalciferol, (DRISDOL) 50000 units CAPS capsule Take 50,000 Units by mouth every 7 (seven) days.    Historical Provider, MD    Family History History reviewed. No pertinent family history.  Social History Social History  Substance Use Topics  . Smoking status: Never Smoker  . Smokeless tobacco: Never Used  . Alcohol use No     Allergies   Demerol [meperidine]; Gabapentin; and Statins   Review of Systems Review of Systems  Constitutional: Negative for activity change, fatigue and fever.  Respiratory: Positive for chest tightness. Negative for shortness of breath.   Cardiovascular: Positive for chest pain.  Gastrointestinal: Negative for abdominal pain, nausea and vomiting.  Skin: Negative for pallor, rash and wound.  All other  systems reviewed and are negative.    Physical Exam Updated Vital Signs BP 133/78   Pulse (!) 58   Temp 98.7 F (37.1 C) (Oral)   Resp 18   Ht  (1.499 m)   Wt 144 lb (65.3 kg)   SpO2 96%   BMI 29.08 kg/m   Physical Exam  Constitutional: She is oriented to person, place, and time. She appears well-developed and well-nourished.  HENT:  Head: Normocephalic and atraumatic.  Eyes: Right eye exhibits no discharge.  Cardiovascular: Normal rate, regular rhythm and normal heart sounds.   No murmur heard. Pulmonary/Chest: Effort normal and breath sounds normal. She has no wheezes. She has no rales. She exhibits tenderness.  Tenderness to palpation to the right chest wall  Abdominal: Soft. She  exhibits no distension. There is tenderness.  Patient has mild tenderness palpation although is distractible to the right upper quadrant right lower quadrant.  Neurological: She is oriented to person, place, and time.  Skin: Skin is warm and dry. She is not diaphoretic.  Psychiatric: She has a normal mood and affect.  Nursing note and vitals reviewed.    ED Treatments / Results  Labs (all labs ordered are listed, but only abnormal results are displayed) Labs Reviewed - No data to display  EKG  EKG Interpretation None       Radiology No results found.  Procedures Procedures (including critical care time)  Medications Ordered in ED Medications - No data to display   Initial Impression / Assessment and Plan / ED Course  I have reviewed the triage vital signs and the nursing notes.  Pertinent labs & imaging results that were available during my care of the patient were reviewed by me and considered in my medical decision making (see chart for details).     73 her old female presenting with chest wall pain. Patient reports the pain has gotten worse since her fall last week. She had no imaging at the of this area at that time. We will get a chest x-ray to review for rib fractures. Patient does not have any external signs of trauma. Patient has diffuse tenderness. Patient eating drinking normally has normal vital signs which are reassuring. Given the fact that the fall was week ago, does doubt serious intra-abdominal or thoracic pathology.  Patient ambulated well. Discussed with daughter who thought her mom may be exaggerating.  She walks at baseline and is taking PO.   Final Clinicshe is an 83 year old female presenting with rib pain to thatal Impressions(s) / ED Diagnoses   Final diagnoses:  None    New Prescriptions New Prescriptions   No medications on file     Courteney Randall An, MD 08/09/16 1605

## 2016-08-07 NOTE — ED Notes (Signed)
Patient was offered fluids but denied them.

## 2016-08-07 NOTE — ED Triage Notes (Signed)
Pt seen in ED last Wednesday for fall, pt states as soon as she got home her right ribs started hurting, denies another fall

## 2016-08-07 NOTE — ED Notes (Signed)
Pt does not need to be ambulate per RN Madelaine Bhat

## 2016-08-13 ENCOUNTER — Emergency Department (HOSPITAL_BASED_OUTPATIENT_CLINIC_OR_DEPARTMENT_OTHER): Payer: Medicare Other

## 2016-08-13 ENCOUNTER — Encounter (HOSPITAL_BASED_OUTPATIENT_CLINIC_OR_DEPARTMENT_OTHER): Payer: Self-pay | Admitting: Emergency Medicine

## 2016-08-13 ENCOUNTER — Emergency Department (HOSPITAL_BASED_OUTPATIENT_CLINIC_OR_DEPARTMENT_OTHER)
Admission: EM | Admit: 2016-08-13 | Discharge: 2016-08-14 | Disposition: A | Discharge status: Home / Self Care | Payer: Medicare Other | Attending: Emergency Medicine | Admitting: Emergency Medicine

## 2016-08-13 DIAGNOSIS — S0990XA Unspecified injury of head, initial encounter: Secondary | ICD-10-CM | POA: Diagnosis present

## 2016-08-13 DIAGNOSIS — E119 Type 2 diabetes mellitus without complications: Secondary | ICD-10-CM | POA: Diagnosis not present

## 2016-08-13 DIAGNOSIS — S80211A Abrasion, right knee, initial encounter: Secondary | ICD-10-CM | POA: Insufficient documentation

## 2016-08-13 DIAGNOSIS — I10 Essential (primary) hypertension: Secondary | ICD-10-CM | POA: Insufficient documentation

## 2016-08-13 DIAGNOSIS — W01198A Fall on same level from slipping, tripping and stumbling with subsequent striking against other object, initial encounter: Secondary | ICD-10-CM | POA: Insufficient documentation

## 2016-08-13 DIAGNOSIS — E039 Hypothyroidism, unspecified: Secondary | ICD-10-CM | POA: Diagnosis not present

## 2016-08-13 DIAGNOSIS — W19XXXA Unspecified fall, initial encounter: Secondary | ICD-10-CM

## 2016-08-13 DIAGNOSIS — Y92129 Unspecified place in nursing home as the place of occurrence of the external cause: Secondary | ICD-10-CM | POA: Diagnosis not present

## 2016-08-13 DIAGNOSIS — S01111A Laceration without foreign body of right eyelid and periocular area, initial encounter: Secondary | ICD-10-CM | POA: Diagnosis not present

## 2016-08-13 DIAGNOSIS — I2510 Atherosclerotic heart disease of native coronary artery without angina pectoris: Secondary | ICD-10-CM | POA: Diagnosis not present

## 2016-08-13 DIAGNOSIS — Z7982 Long term (current) use of aspirin: Secondary | ICD-10-CM | POA: Insufficient documentation

## 2016-08-13 DIAGNOSIS — Y999 Unspecified external cause status: Secondary | ICD-10-CM | POA: Insufficient documentation

## 2016-08-13 DIAGNOSIS — Z23 Encounter for immunization: Secondary | ICD-10-CM | POA: Diagnosis not present

## 2016-08-13 DIAGNOSIS — Y939 Activity, unspecified: Secondary | ICD-10-CM | POA: Diagnosis not present

## 2016-08-13 DIAGNOSIS — S0101XA Laceration without foreign body of scalp, initial encounter: Secondary | ICD-10-CM

## 2016-08-13 DIAGNOSIS — M25561 Pain in right knee: Secondary | ICD-10-CM

## 2016-08-13 MED ORDER — TETANUS-DIPHTH-ACELL PERTUSSIS 5-2.5-18.5 LF-MCG/0.5 IM SUSP
0.5000 mL | Freq: Once | INTRAMUSCULAR | Status: AC
  Administered 2016-08-13: 0.5 mL via INTRAMUSCULAR
  Filled 2016-08-13: qty 0.5

## 2016-08-13 MED ORDER — LIDOCAINE-EPINEPHRINE (PF) 2 %-1:200000 IJ SOLN
10.0000 mL | Freq: Once | INTRAMUSCULAR | Status: AC
  Administered 2016-08-13: 20 mL

## 2016-08-13 MED ORDER — LIDOCAINE-EPINEPHRINE (PF) 2 %-1:200000 IJ SOLN
INTRAMUSCULAR | Status: AC
  Administered 2016-08-13: 20 mL
  Filled 2016-08-13: qty 20

## 2016-08-13 NOTE — ED Provider Notes (Signed)
[facility] Provider Note   CSN: [id number] Arrival date & time: 08/13/16  2007 By signing my name below, I, Bridgette Habermann, attest that this documentation has been prepared under the direction and in the presence of Olegario Emberson, [place]. Electronically Signed: Bridgette Habermann, ED Scribe. 08/13/16. 10:04 PM.  History   Chief Complaint Chief Complaint  Patient presents with  . Fall    HPI The history is provided by the patient. No language interpreter was used.   HPI Comments: Noor Witte is a 83 y.o. female with h/o A-fib, CAD, DM, HTN, and seizures, who presents to the Emergency Department by EMS from nursing home for evaluation following a mechanical fall just PTA. Pt tripped over a cord in her room, fell, and struck her head. No LOC. Pt notes she has an area of bruising and a laceration to right supraorbital region with pain (4/10) and 4/10 pain to her right knee with a small abrasion. Pt was not given any medications PTA. She is ambulatory with a walker at baseline. Pt is not on blood thinners. Pt denies eye pain, visual disturbance, photophobia, nausea, vomiting, diarrhea, or any other associated symptoms. She is not sure if she is UTD with her tetanus.   Past Medical History:  Diagnosis Date  . Atrial flutter (HCC)   . B12 deficiency   . Carotid artery stenosis   . Coronary artery disease   . Diabetes mellitus   . Hypertension   . Hypothyroidism   . Macular degeneration   . Osteoporosis   . Seizures (HCC)   . Unsteady gait   . Vitamin D deficiency     There are no active problems to display for this patient.   Past Surgical History:  Procedure Laterality Date  . CHOLECYSTECTOMY    . gastic staples    . HIP SURGERY  2006   right hip replacement     OB History    No data available       Home Medications    Prior to Admission medications   Medication Sig Start Date End Date Taking? Authorizing Provider  acetaminophen (TYLENOL) 500 MG tablet Take  500 mg by mouth every 6 (six) hours as needed. For pain   Yes Historical Provider, MD  aspirin 81 MG chewable tablet Chew by mouth daily.   Yes Historical Provider, MD  digoxin (LANOXIN) 0.125 MG tablet Take 0.125 mg by mouth daily.   Yes Historical Provider, MD  divalproex (DEPAKOTE) 125 MG DR tablet Take 125 mg by mouth 3 (three) times daily.   Yes Historical Provider, MD  DULoxetine (CYMBALTA) 60 MG capsule Take 60 mg by mouth daily.    Yes Historical Provider, MD  escitalopram (LEXAPRO) 10 MG tablet Take 10 mg by mouth daily.   Yes Historical Provider, MD  estradiol (ESTRACE) 0.1 MG/GM vaginal cream Place 1 Applicatorful vaginally every 14 (fourteen) days.   Yes Historical Provider, MD  levothyroxine (SYNTHROID, LEVOTHROID) 88 MCG tablet Take 88 mcg by mouth daily before breakfast.   Yes Historical Provider, MD  loratadine (CLARITIN) 10 MG tablet Take 10 mg by mouth daily.   Yes Historical Provider, MD  metoprolol (LOPRESSOR) 50 MG tablet Take 25 mg by mouth 2 (two) times daily.   Yes Historical Provider, MD  vitamin B-12 (CYANOCOBALAMIN) 1000 MCG tablet Take 1,000 mcg by mouth daily.   Yes Historical Provider, MD  Vitamin D, Ergocalciferol, (DRISDOL) 50000 units CAPS capsule Take 50,000 Units by mouth every 7 (seven) days.  Yes Historical Provider, MD    Family History No family history on file.  Social History Social History  Substance Use Topics  . Smoking status: Never Smoker  . Smokeless tobacco: Never Used  . Alcohol use No     Allergies   Demerol [meperidine]; Gabapentin; and Statins   Review of Systems Review of Systems  Constitutional: Negative for chills and fever.  Eyes: Negative for photophobia, pain and visual disturbance.  Gastrointestinal: Negative for diarrhea, nausea and vomiting.  Musculoskeletal: Positive for arthralgias.  Skin: Positive for wound.  Neurological: Negative for syncope.  All other systems reviewed and are negative.  Physical Exam Updated  Vital Signs BP 132/84 (BP Location: Right Arm)   Pulse 78   Temp 97.7 F (36.5 C) (Oral)   Resp 16   Ht  (1.499 m)   Wt 65.3 kg   SpO2 96%   BMI 29.08 kg/m   Physical Exam  Constitutional: She is oriented to person, place, and time. She appears well-developed and well-nourished.  Well appearing  HENT:  Head: Normocephalic. Head is with laceration.  Nose: Nose normal.  Mouth/Throat: Oropharynx is clear and moist.  Right supraorbital laceration (1.5 cm), no active bleeding. Swelling and bruising present to the area. No pain or tenderness with movement of eye during EOM exam.  Negative raccoon's eyes. Negative Battle sign.  Eyes: Conjunctivae and EOM are normal. Pupils are equal, round, and reactive to light.  Neck: Normal range of motion. Muscular tenderness present.  Tenderness to palpation to right side of cervical neck.  Cardiovascular: Normal rate, normal heart sounds and intact distal pulses.   No murmur heard. Pulmonary/Chest: Effort normal and breath sounds normal. No respiratory distress. She has no wheezes. She has no rales.  Normal work of breathing. No respiratory distress noted.   Abdominal: Soft. Bowel sounds are normal. There is no tenderness. There is no rebound and no guarding.  Musculoskeletal: Normal range of motion. She exhibits tenderness.  Abrasion (2x1 cm) over right knee, no active bleeding or laceration. Tenderness to palpation over the left patella.  No midline, C-spine, T-spine, or L-spine tenderness. Abrasion (2.5x1 cm) on the right forearm over brachioradialis, no tenderness to palpation. Good ROM of bilateral knees.  Neurological: She is alert and oriented to person, place, and time. She displays normal reflexes. No cranial nerve deficit or sensory deficit. Coordination normal.  Cranial Nerves:  III,IV, VI: ptosis not present, extra-ocular movements intact bilaterally, direct and consensual pupillary light reflexes intact bilaterally V: facial  sensation, jaw opening, and bite strength equal bilaterally VII: eyebrow raise, eyelid close, smile, frown, pucker equal bilaterally VIII: hearing grossly normal bilaterally  IX,X: palate elevation and swallowing intact XI: bilateral shoulder shrug and lateral head rotation equal and strong XII: midline tongue extension  Negative pronator drift, negative Romberg, negative RAM's, negative heel-to-shin, negative finger to nose.    Sensory intact.  Muscle strength 5/5 Patient able to ambulate without difficulty.   Skin: Skin is warm. Abrasion noted.  Psychiatric: She has a normal mood and affect. Her behavior is normal.  Nursing note and vitals reviewed.  ED Treatments / Results  DIAGNOSTIC STUDIES: Oxygen Saturation is 96% on RA, adequate by my interpretation.   COORDINATION OF CARE: 10:04 PM-Discussed next steps with pt. Pt verbalized understanding and is agreeable with the plan.   Labs (all labs ordered are listed, but only abnormal results are displayed) Labs Reviewed - No data to display  EKG  EKG Interpretation None  Radiology Ct Head Wo Contrast  Result Date: 08/13/2016 CLINICAL DATA:  83 y/o F; status post fall with head injury. Pain in discoloration over the right eye. EXAM: CT HEAD WITHOUT CONTRAST CT CERVICAL SPINE WITHOUT CONTRAST TECHNIQUE: Multidetector CT imaging of the head and cervical spine was performed following the standard protocol without intravenous contrast. Multiplanar CT image reconstructions of the cervical spine were also generated. COMPARISON:  08/02/2016 CT of the head and cervical spine. 01/30/2012 CT of the head. FINDINGS: CT HEAD FINDINGS Brain: No evidence of acute infarction, hemorrhage, hydrocephalus, extra-axial collection or mass lesion/mass effect. Stable moderate chronic microvascular ischemic changes and parenchymal volume loss of the brain. Stable lucencies in the left thalamus and left lentiform nucleus compatible with chronic lacunar  infarcts. Vascular: Moderate calcific atherosclerosis of the cavernous and internal carotid arteries. Skull: Stable left frontal inner table a 9 mm calcification may represent a calcified plaque or meningioma and is stable from 2013. Sinuses/Orbits: No acute finding. Other: Bilateral intra-ocular lens replacement. Bilateral high-riding jugular bulbs. CT CERVICAL SPINE FINDINGS Alignment: Stable grade 1 C6-7 anterolisthesis. Skull base and vertebrae: No acute fracture. No primary bone lesion or focal pathologic process. Soft tissues and spinal canal: No prevertebral fluid or swelling. No visible canal hematoma. Disc levels: Moderate cervical spondylosis with discogenic degenerative changes predominantly at the C5 through C7 levels and left-greater-than-right facet arthrosis. Upper chest: Negative. Other: Moderate calcific atherosclerosis of the cavernous internal carotid arteries. IMPRESSION: 1. No acute intracranial abnormality or displaced calvarial fracture. 2. No acute fracture or dislocation of the cervical spine. 3. Stable moderate chronic microvascular ischemic changes and parenchymal volume loss of the brain. Stable chronic basal ganglia lacunar infarcts. 4. Moderate cervical spondylosis predominantly at the C5 through C7 levels. Electronically Signed   By: Mitzi Hansen M.D.   On: 08/13/2016 23:12   Ct Cervical Spine Wo Contrast  Result Date: 08/13/2016 CLINICAL DATA:  83 y/o F; status post fall with head injury. Pain in discoloration over the right eye. EXAM: CT HEAD WITHOUT CONTRAST CT CERVICAL SPINE WITHOUT CONTRAST TECHNIQUE: Multidetector CT imaging of the head and cervical spine was performed following the standard protocol without intravenous contrast. Multiplanar CT image reconstructions of the cervical spine were also generated. COMPARISON:  08/02/2016 CT of the head and cervical spine. 01/30/2012 CT of the head. FINDINGS: CT HEAD FINDINGS Brain: No evidence of acute infarction,  hemorrhage, hydrocephalus, extra-axial collection or mass lesion/mass effect. Stable moderate chronic microvascular ischemic changes and parenchymal volume loss of the brain. Stable lucencies in the left thalamus and left lentiform nucleus compatible with chronic lacunar infarcts. Vascular: Moderate calcific atherosclerosis of the cavernous and internal carotid arteries. Skull: Stable left frontal inner table a 9 mm calcification may represent a calcified plaque or meningioma and is stable from 2013. Sinuses/Orbits: No acute finding. Other: Bilateral intra-ocular lens replacement. Bilateral high-riding jugular bulbs. CT CERVICAL SPINE FINDINGS Alignment: Stable grade 1 C6-7 anterolisthesis. Skull base and vertebrae: No acute fracture. No primary bone lesion or focal pathologic process. Soft tissues and spinal canal: No prevertebral fluid or swelling. No visible canal hematoma. Disc levels: Moderate cervical spondylosis with discogenic degenerative changes predominantly at the C5 through C7 levels and left-greater-than-right facet arthrosis. Upper chest: Negative. Other: Moderate calcific atherosclerosis of the cavernous internal carotid arteries. IMPRESSION: 1. No acute intracranial abnormality or displaced calvarial fracture. 2. No acute fracture or dislocation of the cervical spine. 3. Stable moderate chronic microvascular ischemic changes and parenchymal volume loss of the brain. Stable chronic basal  ganglia lacunar infarcts. 4. Moderate cervical spondylosis predominantly at the C5 through C7 levels. Electronically Signed   By: Mitzi Hansen M.D.   On: 08/13/2016 23:12   Dg Knee Complete 4 Views Right  Result Date: 08/13/2016 CLINICAL DATA:  Multiple falls most recently today with abrasion anterior knee and pain. EXAM: RIGHT KNEE - COMPLETE 4+ VIEW COMPARISON:  None. FINDINGS: No evidence of fracture, dislocation, or joint effusion. No evidence of arthropathy or other focal bone abnormality. Soft  tissues are unremarkable. IMPRESSION: Negative. Electronically Signed   By: Elberta Fortis M.D.   On: 08/13/2016 23:02    Procedures .Marland KitchenLaceration Repair Date/Time: 08/14/2016 12:43 AM Performed by: Alvina Chou Authorized by: Alvina Chou   Consent:    Consent obtained:  Verbal   Consent given by:  Patient   Risks discussed:  Infection, pain, poor cosmetic result and poor wound healing   Alternatives discussed:  No treatment, delayed treatment and observation Anesthesia (see MAR for exact dosages):    Anesthesia method:  Local infiltration   Local anesthetic:  Lidocaine 2% WITH epi Laceration details:    Location:  Face   Face location:  R eyebrow   Length (cm):  1.5   Depth (mm):  2 Repair type:    Repair type:  Simple Pre-procedure details:    Preparation:  Patient was prepped and draped in usual sterile fashion Exploration:    Hemostasis achieved with:  Direct pressure   Wound exploration: wound explored through full range of motion     Wound extent: areolar tissue violated     Wound extent: no fascia violation noted, no foreign bodies/material noted, no nerve damage noted, no tendon damage noted, no underlying fracture noted and no vascular damage noted     Contaminated: no   Treatment:    Area cleansed with:  Betadine   Amount of cleaning:  Standard   Irrigation solution:  Sterile saline   Irrigation volume:  20 cc   Irrigation method:  Pressure wash and syringe   Visualized foreign bodies/material removed: no   Skin repair:    Repair method:  Sutures   Suture size:  6-0   Suture material:  Prolene   Suture technique:  Simple interrupted   Number of sutures:  3 Approximation:    Approximation:  Close   Vermilion border: well-aligned   Post-procedure details:    Dressing:  Antibiotic ointment and sterile dressing   Patient tolerance of procedure:  Tolerated well, no immediate complications   (including critical care time)  Medications  Ordered in ED Medications  lidocaine-EPINEPHrine (XYLOCAINE W/EPI) 2 %-1:200000 (PF) injection 10 mL (20 mLs Infiltration Given 08/13/16 2340)  Tdap (BOOSTRIX) injection 0.5 mL (0.5 mLs Intramuscular Given 08/13/16 2341)     Initial Impression / Assessment and Plan / ED Course  I have reviewed the triage vital signs and the nursing notes.  Pertinent labs & imaging results that were available during my care of the patient were reviewed by me and considered in my medical decision making (see chart for details).     Patient here status post mechanical fall. CT head negative for any acute intracranial abnormalities, CT neck negative. X-ray negative right knee. Patient is in no apparent distress, afebrile, hemodynamically stable. She is A&O 3 and nontoxic appearing. Neurologically intact. Cranial nerves III through XII intact. Coordination intact. Patient was able to stand and ambulate without difficulty.  Heart and lung sounds are clear. Patient had a 1.5 cm laceration to  supraorbital right eye. Patient had no pain on movement of eye. No signs of injury to eye. Low suspicion for orbital fracture.   Tetanus updated in ED. Laceration occurred < 12 hours prior to repair. Discussed laceration care with pt and answered questions. Pt to f-u for suture removal in 3-5 days and wound check sooner should there be signs of dehiscence or infection. Pt is hemodynamically stable with no complaints prior to dc.   Patient also seen and evaluated by Dr. Fredderick Phenix who agreed with assessment and plan.    Final Clinical Impressions(s) / ED Diagnoses   Final diagnoses:  Fall, initial encounter  Laceration of scalp, initial encounter  Acute pain of right knee    New Prescriptions New Prescriptions   No medications on file   I personally performed the services described in this documentation, which was scribed in my presence. The recorded information has been reviewed and is accurate.    [address] [place],  [place] 08/14/16 0101    Rolan Bucco, MD 08/14/16 1357

## 2016-08-13 NOTE — ED Triage Notes (Addendum)
Pt tripped over cord in room at nursing home.  No loss of consciousness, patient alert and oriented and remembers events surrounding fall.  Pt denies pain while at rest, pain with palpation to right supra-orbital region with area of bruising and laceration present with minimal amount of active bleeding.  Pt also presents with small abrasion to right knee.  Pt landed on carpet floor, hitting right side of forehead on carpet floor.  Vitals from EMS during transport:  126/88, hr 64, resp 18, 95% RA, CBG=124

## 2016-08-14 NOTE — Discharge Instructions (Signed)
Please go see her primary care provider or come back here to the ED to have her sutures removed and for wound recheck in 3-5 days. Please keep area clean and dry. Do not drench area with water.  Get help right away if: You develop severe swelling around the injury site. Your pain suddenly increases and is severe. You develop painful lumps near the wound or on skin that is anywhere on your body. You have a red streak going away from your wound. The wound is on your hand or foot and you cannot properly move a finger or toe. The wound is on your hand or foot and you notice that your fingers or toes look pale or bluish. Get help right away if: Your knee feels warm to the touch. You cannot move your knee. You have severe pain in your knee. You have chest pain. You have trouble breathing.

## 2016-08-14 NOTE — ED Notes (Signed)
Report given by Rosalita Chessman, RN

## 2016-08-14 NOTE — ED Notes (Signed)
Contacted [facility] for transport back to patient's home ([facility] at [address], [place], [place])

## 2018-01-08 IMAGING — DX DG KNEE COMPLETE 4+V*R*
4 series · 4 of 4 positions shown · non-contrast
Comparison: None.

CLINICAL DATA: Multiple falls most recently today with abrasion
anterior knee and pain.

EXAM:
RIGHT KNEE - COMPLETE 4+ VIEW

[knee ap]
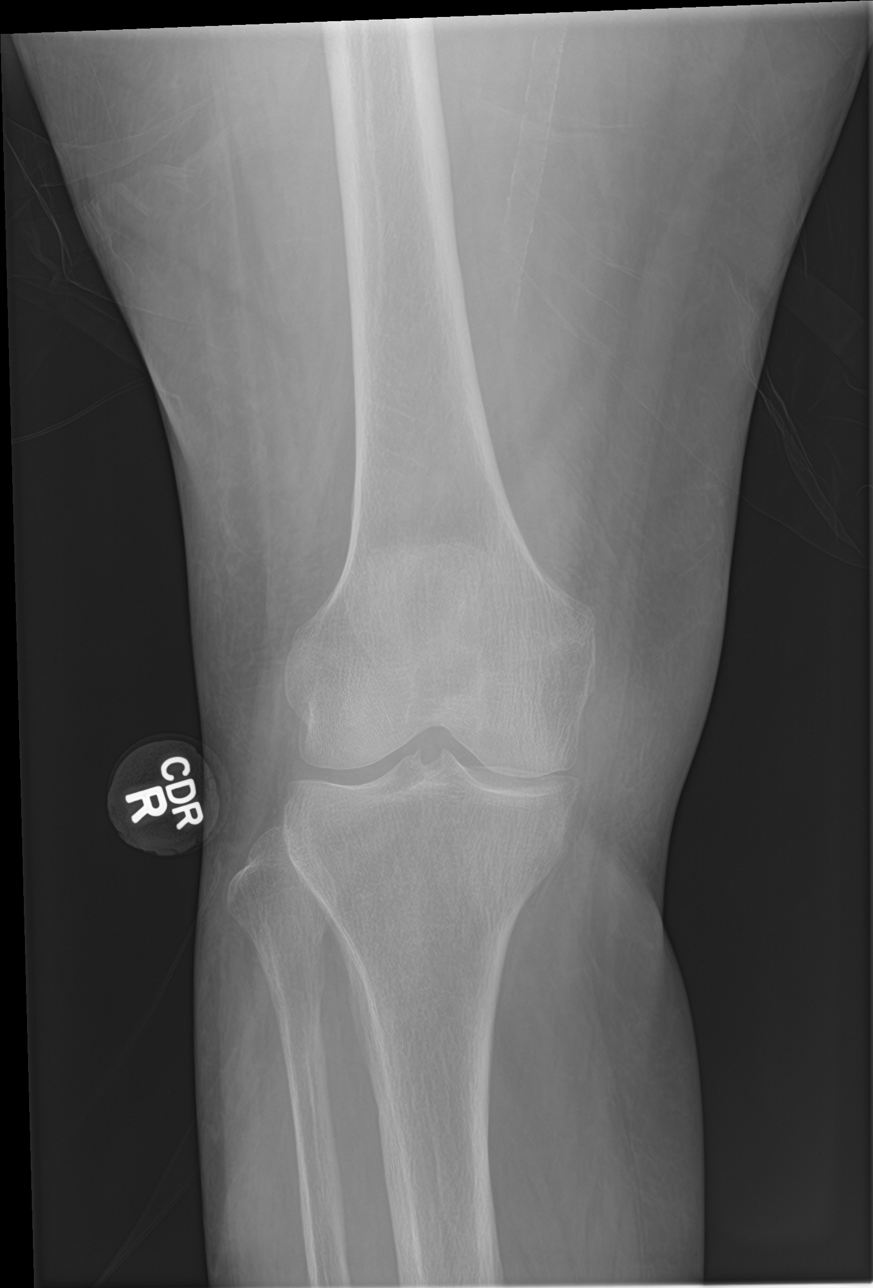

[knee lat]
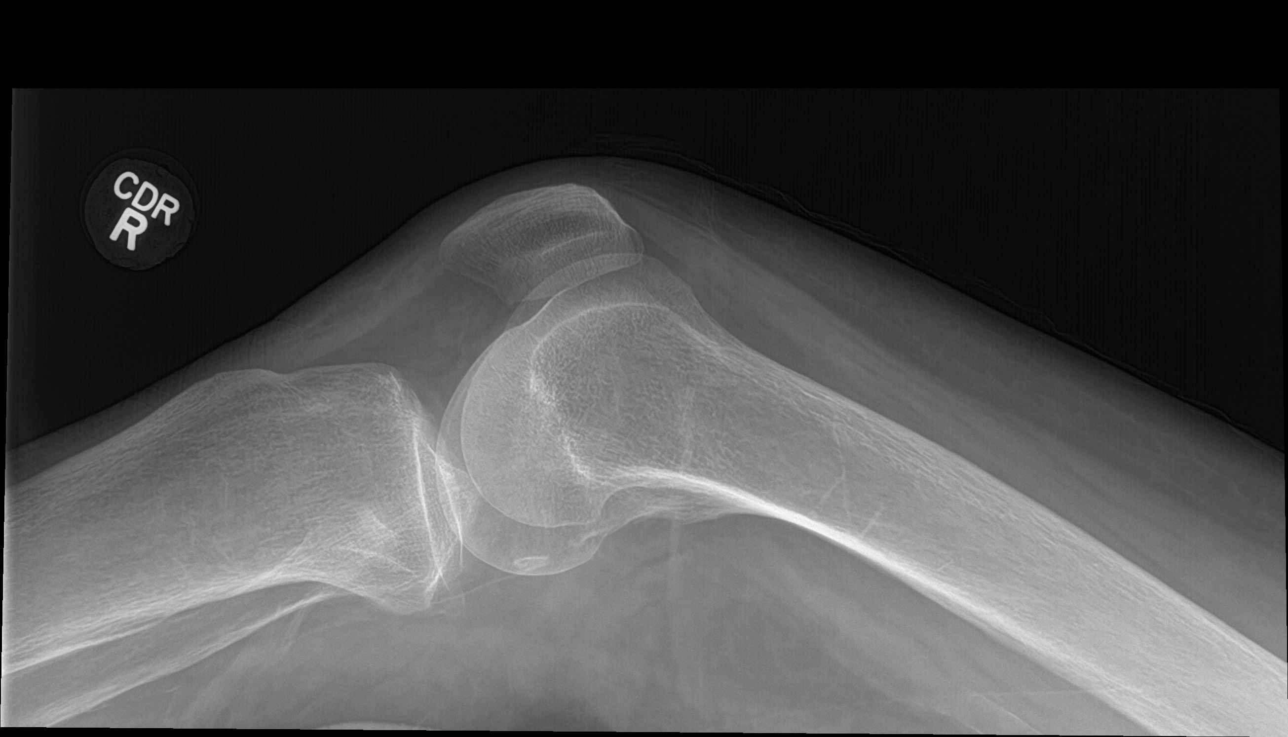

[knee obl (1 of 2)]
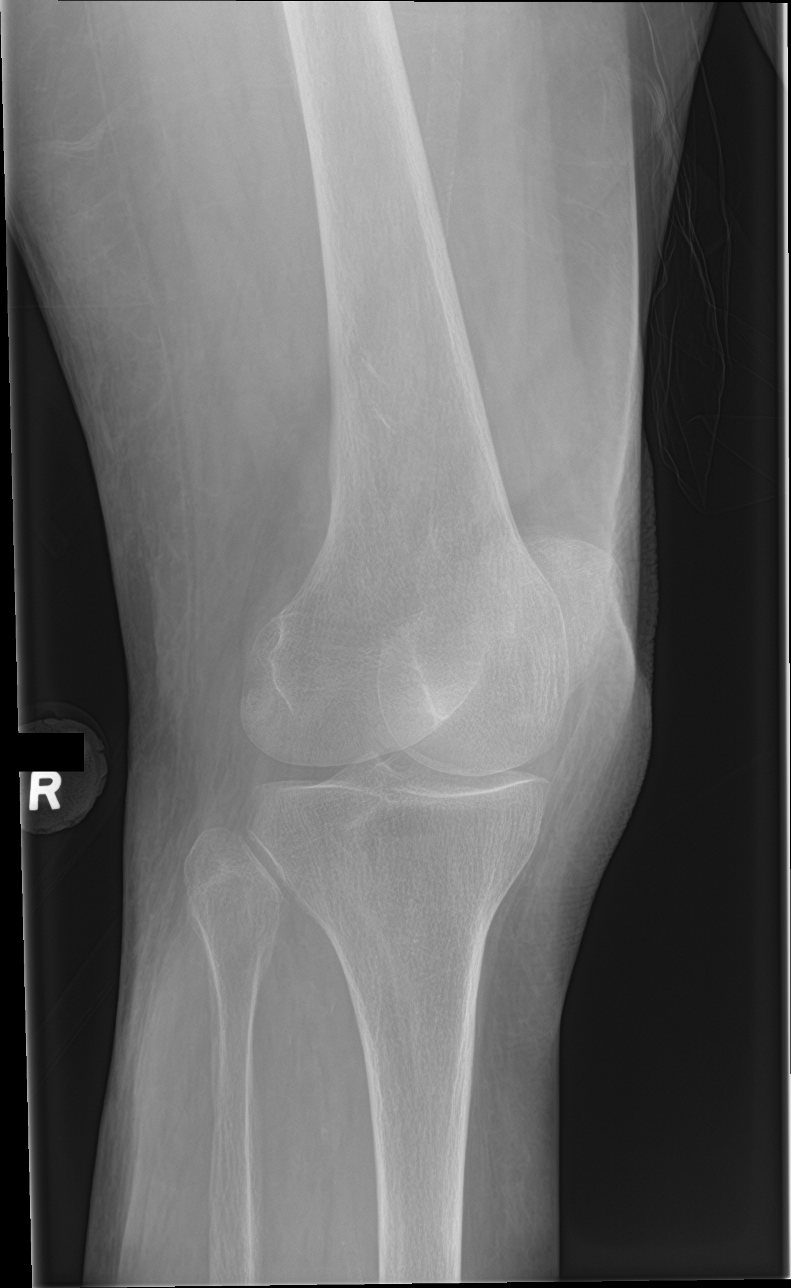

[knee obl (2 of 2)]
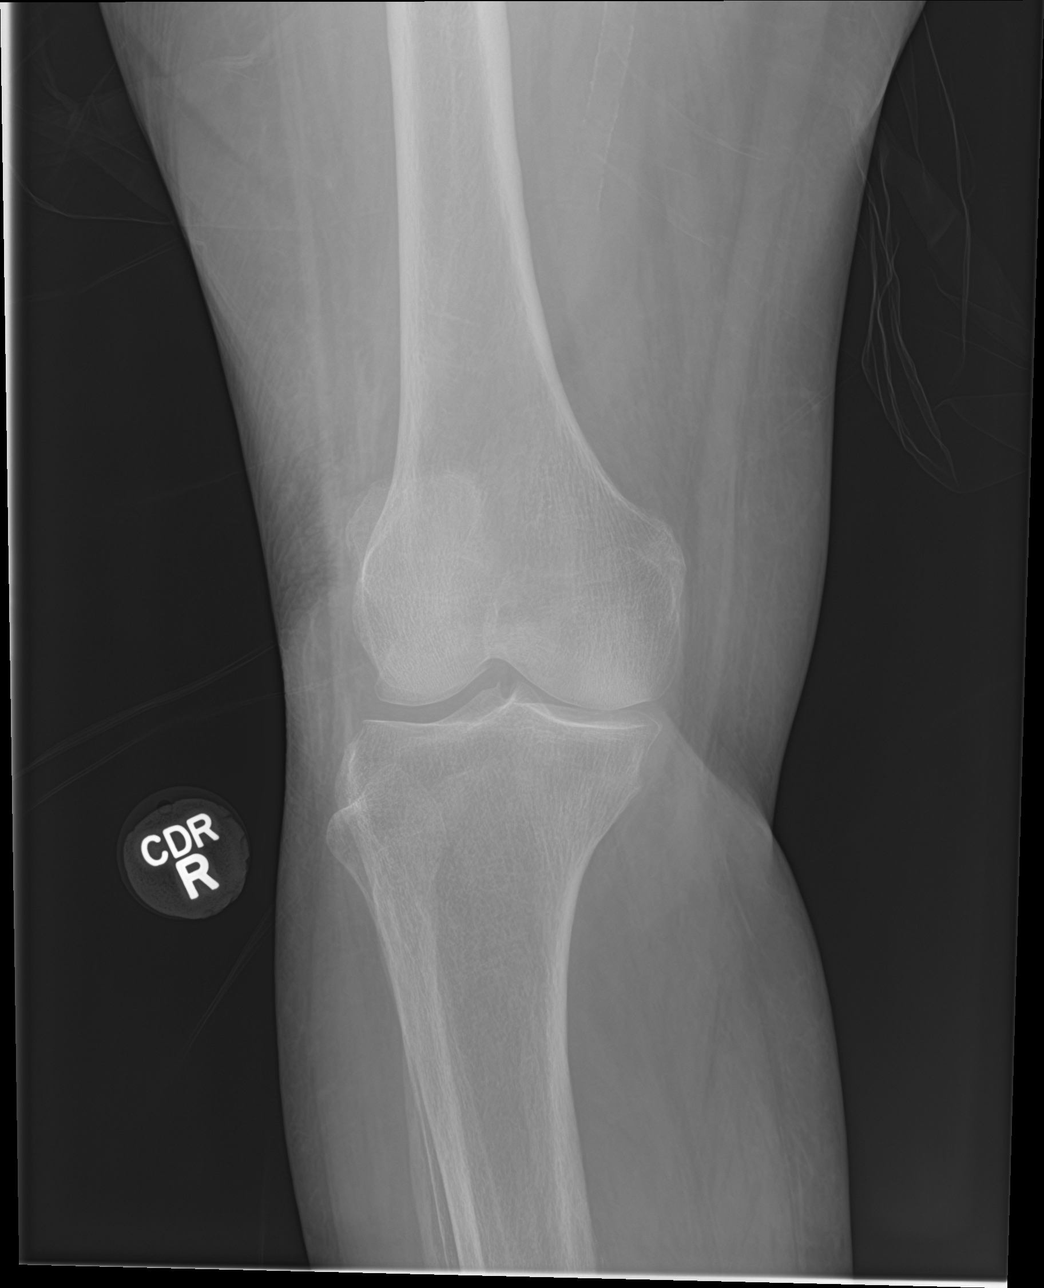

[4 of 4 positions shown; findings below may reference images not displayed]

FINDINGS: No evidence of fracture, dislocation, or joint effusion. No evidence
of arthropathy or other focal bone abnormality. Soft tissues are
unremarkable.
IMPRESSION: Negative.

## 2018-01-08 IMAGING — CT CT CERVICAL SPINE W/O CM
4 of 7 series · 12 of 33 positions shown, 13 images · non-contrast
Comparison: 08/02/2016 CT of the head and cervical spine.
01/30/2012 CT of the head.

CLINICAL DATA: 88 y/o F; status post fall with head injury. Pain in
discoloration over the right eye.

EXAM:
CT HEAD WITHOUT CONTRAST
CT CERVICAL SPINE WITHOUT CONTRAST
TECHNIQUE: Multidetector CT imaging of the head and cervical spine was
performed following the standard protocol without intravenous
contrast. Multiplanar CT image reconstructions of the cervical spine
were also generated.

[Series 7: c_spine 2.0 i30s 3 · axial · 0.29mm/px · z∈[+823,+891]mm · 3 of 69 slices shown]
[im 18/69  bone]
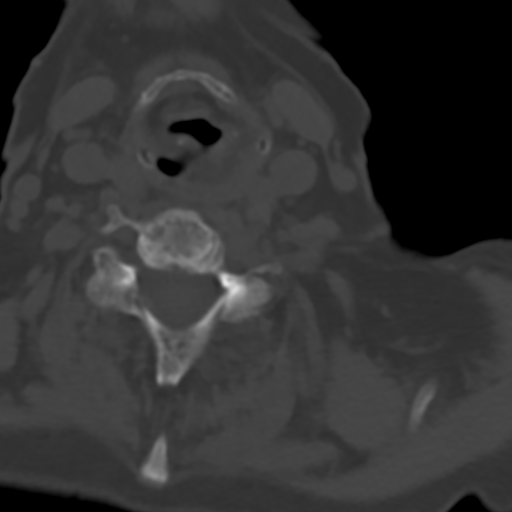
[im 35/69  bone]
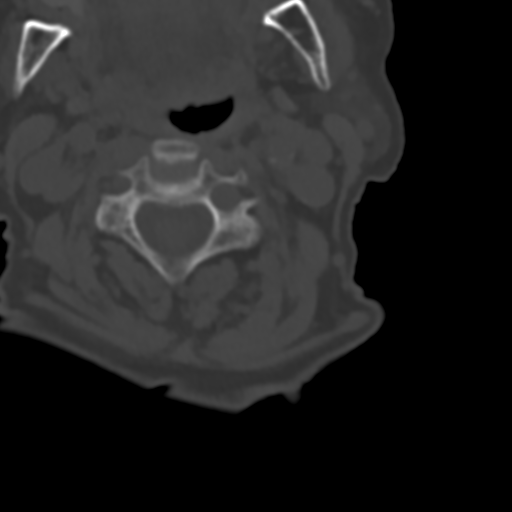
[im 52/69  bone]
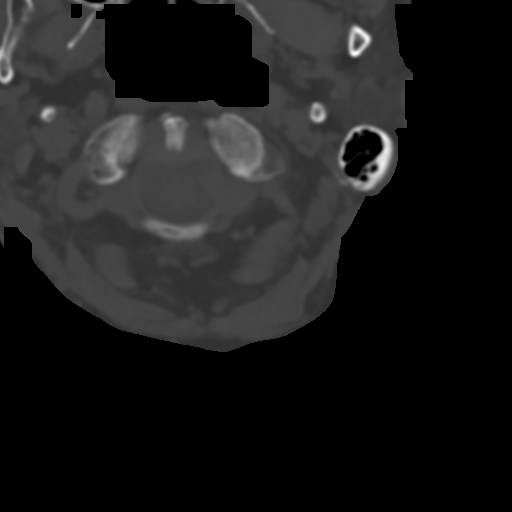

[Series 9: coronals · coronal · 0.23mm/px · 1 of 61 slices shown]
[im 31/61  bone]
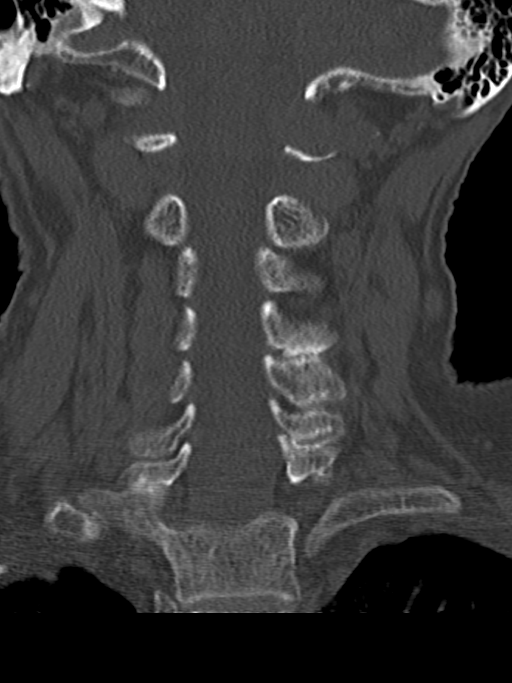

[Series 10: sagittals · sagittal · 0.21mm/px · 4 of 48 slices shown]
[im 10/48  bone]
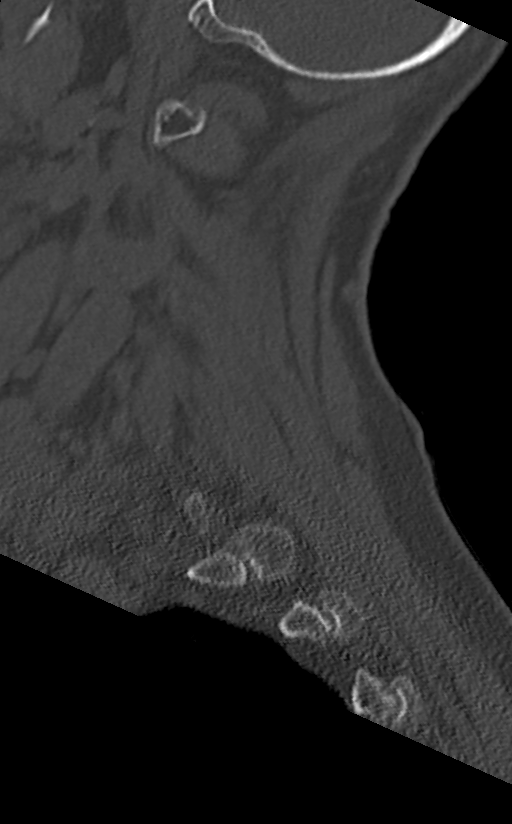
[im 19/48  bone]
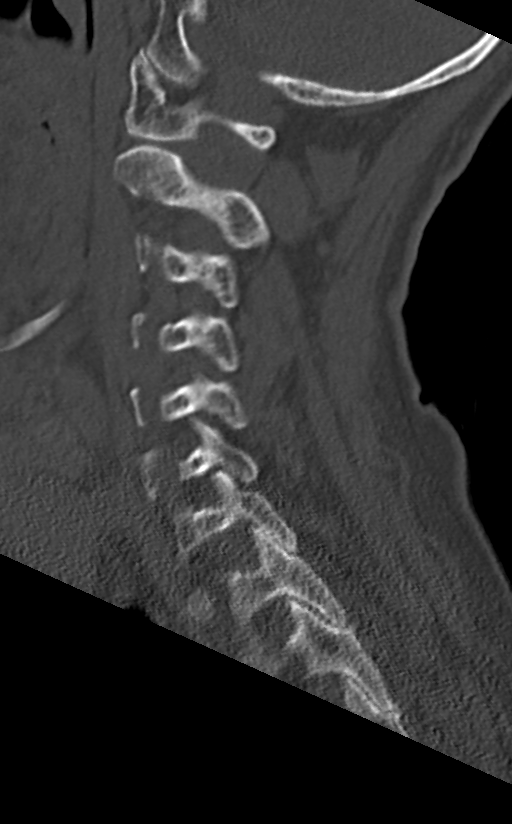
[im 29/48  bone]
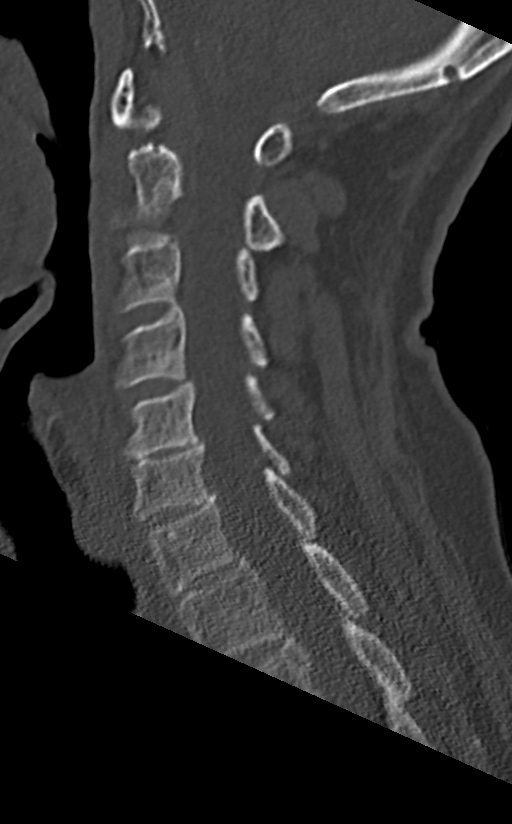
[im 38/48  bone]
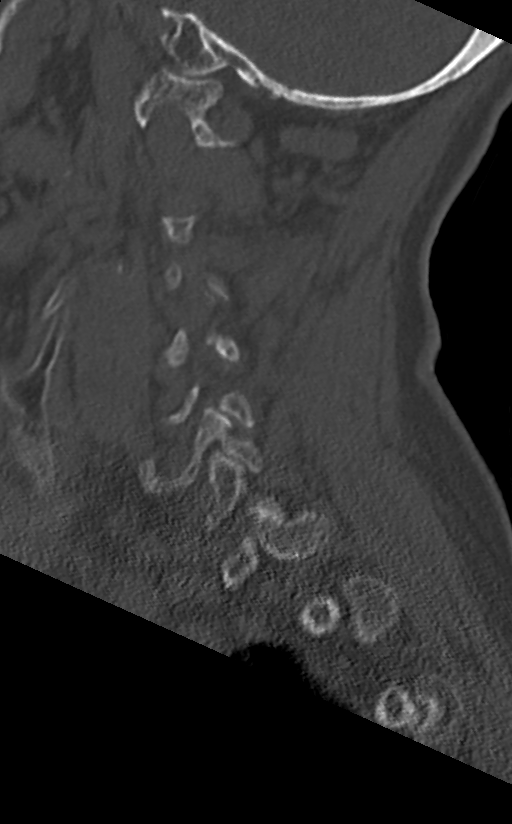

[Series 11: orthogonals · axial · 0.23mm/px · z∈[+780,+864]mm · 4 of 84 slices shown, 5 images]
[im 17/84  soft-tissue]
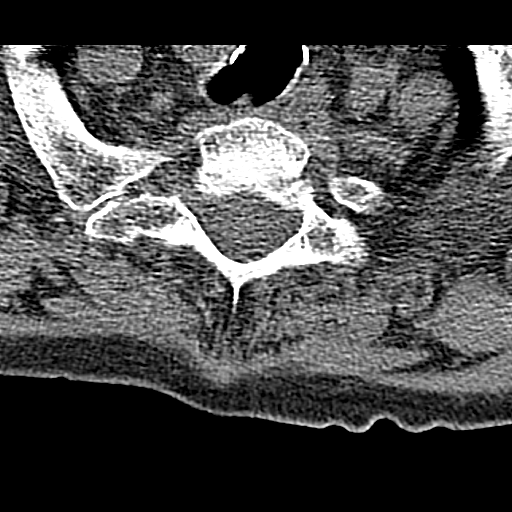
[im 17/84  bone]
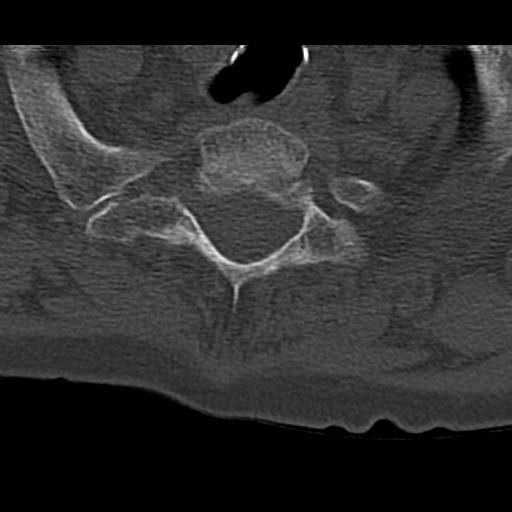
[im 34/84  bone]
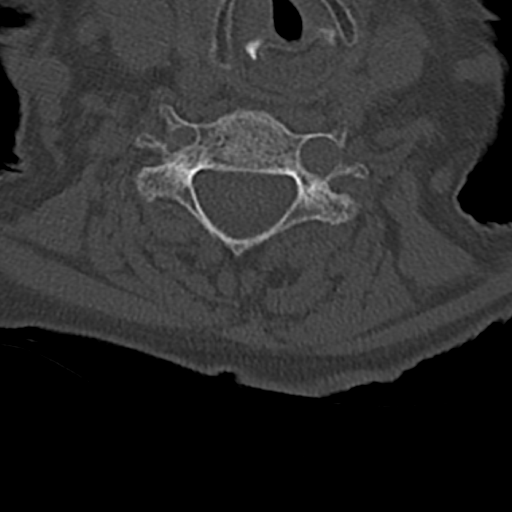
[im 50/84  bone]
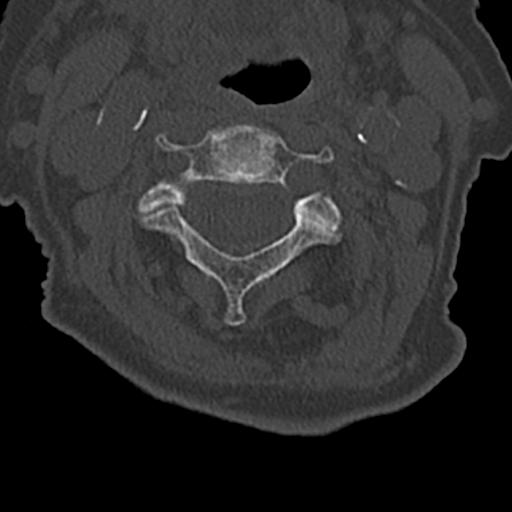
[im 67/84  bone]
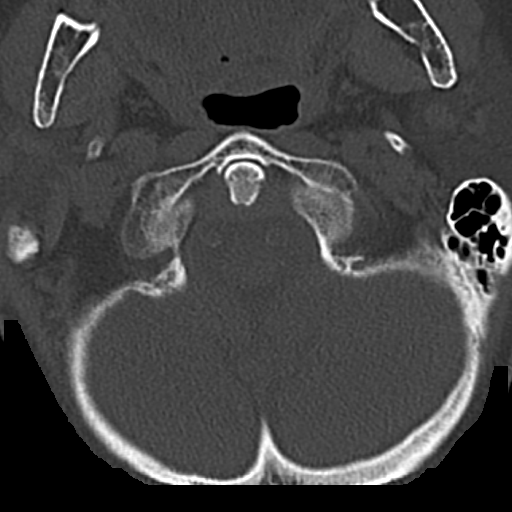

[12 of 33 positions shown; findings below may reference images not displayed]

FINDINGS: CT HEAD FINDINGS

Brain: No evidence of acute infarction, hemorrhage, hydrocephalus,
extra-axial collection or mass lesion/mass effect. Stable moderate
chronic microvascular ischemic changes and parenchymal volume loss
of the brain. Stable lucencies in the left thalamus and left
lentiform nucleus compatible with chronic lacunar infarcts.

Vascular: Moderate calcific atherosclerosis of the cavernous and
internal carotid arteries.

Skull: Stable left frontal inner table a 9 mm calcification may
represent a calcified plaque or meningioma and is stable from 9419.

Sinuses/Orbits: No acute finding.

Other: Bilateral intra-ocular lens replacement. Bilateral
high-riding jugular bulbs.

CT CERVICAL SPINE FINDINGS

Alignment: Stable grade 1 C6-7 anterolisthesis.

Skull base and vertebrae: No acute fracture. No primary bone lesion
or focal pathologic process.

Soft tissues and spinal canal: No prevertebral fluid or swelling. No
visible canal hematoma.

Disc levels: Moderate cervical spondylosis with discogenic
degenerative changes predominantly at the C5 through C7 levels and
left-greater-than-right facet arthrosis.

Upper chest: Negative.

Other: Moderate calcific atherosclerosis of the cavernous internal
carotid arteries.
IMPRESSION: 1. No acute intracranial abnormality or displaced calvarial
fracture.
2. No acute fracture or dislocation of the cervical spine.
3. Stable moderate chronic microvascular ischemic changes and
parenchymal volume loss of the brain. Stable chronic basal ganglia
lacunar infarcts.
4. Moderate cervical spondylosis predominantly at the C5 through C7
levels.

By: Flori Ayo M.D.
